# Patient Record
Sex: Female | Born: 1970
Health system: Southern US, Community
[De-identification: ages and names within clinical notes are randomized; demographics above are authoritative.]

## PROBLEM LIST (undated history)

## (undated) ENCOUNTER — Emergency Department (HOSPITAL_BASED_OUTPATIENT_CLINIC_OR_DEPARTMENT_OTHER): Payer: Medicaid Other

## (undated) DIAGNOSIS — Z86718 Personal history of other venous thrombosis and embolism: Secondary | ICD-10-CM

## (undated) DIAGNOSIS — I82409 Acute embolism and thrombosis of unspecified deep veins of unspecified lower extremity: Secondary | ICD-10-CM

## (undated) HISTORY — PX: VASCULAR SURGERY: SHX849

---

## 2009-02-01 ENCOUNTER — Emergency Department (HOSPITAL_BASED_OUTPATIENT_CLINIC_OR_DEPARTMENT_OTHER): Admission: EM | Admit: 2009-02-01 | Discharge: 2009-02-01 | Payer: Self-pay | Admitting: Emergency Medicine

## 2009-02-02 ENCOUNTER — Ambulatory Visit: Payer: Self-pay | Admitting: Interventional Radiology

## 2009-02-02 ENCOUNTER — Inpatient Hospital Stay (HOSPITAL_COMMUNITY): Admission: EM | Admit: 2009-02-02 | Discharge: 2009-02-05 | Payer: Self-pay | Admitting: Internal Medicine

## 2009-02-02 ENCOUNTER — Emergency Department (HOSPITAL_BASED_OUTPATIENT_CLINIC_OR_DEPARTMENT_OTHER): Admission: EM | Admit: 2009-02-02 | Discharge: 2009-02-02 | Payer: Self-pay | Admitting: Emergency Medicine

## 2009-07-13 ENCOUNTER — Inpatient Hospital Stay (HOSPITAL_COMMUNITY): Admission: AD | Admit: 2009-07-13 | Discharge: 2009-07-14 | Payer: Self-pay | Admitting: Obstetrics and Gynecology

## 2009-07-13 ENCOUNTER — Other Ambulatory Visit: Payer: Self-pay | Admitting: Emergency Medicine

## 2009-07-13 ENCOUNTER — Ambulatory Visit: Payer: Self-pay | Admitting: Advanced Practice Midwife

## 2009-08-05 ENCOUNTER — Inpatient Hospital Stay (HOSPITAL_COMMUNITY): Admission: AD | Admit: 2009-08-05 | Discharge: 2009-08-05 | Payer: Self-pay | Admitting: Family Medicine

## 2009-08-05 ENCOUNTER — Ambulatory Visit: Payer: Self-pay | Admitting: Nurse Practitioner

## 2009-08-31 ENCOUNTER — Ambulatory Visit (HOSPITAL_COMMUNITY): Admission: RE | Admit: 2009-08-31 | Discharge: 2009-08-31 | Payer: Self-pay | Admitting: Obstetrics and Gynecology

## 2009-09-21 ENCOUNTER — Ambulatory Visit (HOSPITAL_COMMUNITY): Admission: RE | Admit: 2009-09-21 | Discharge: 2009-09-21 | Payer: Self-pay | Admitting: Obstetrics and Gynecology

## 2009-10-03 ENCOUNTER — Ambulatory Visit: Payer: Self-pay | Admitting: Vascular Surgery

## 2009-10-03 ENCOUNTER — Emergency Department (HOSPITAL_COMMUNITY): Admission: EM | Admit: 2009-10-03 | Discharge: 2009-10-03 | Payer: Self-pay | Admitting: Emergency Medicine

## 2009-10-12 ENCOUNTER — Ambulatory Visit (HOSPITAL_COMMUNITY): Admission: RE | Admit: 2009-10-12 | Discharge: 2009-10-12 | Payer: Self-pay | Admitting: Obstetrics and Gynecology

## 2009-10-18 ENCOUNTER — Ambulatory Visit (HOSPITAL_COMMUNITY): Admission: RE | Admit: 2009-10-18 | Discharge: 2009-10-18 | Payer: Self-pay | Admitting: Obstetrics and Gynecology

## 2009-10-18 ENCOUNTER — Encounter: Admission: RE | Admit: 2009-10-18 | Discharge: 2009-11-02 | Payer: Self-pay | Admitting: Obstetrics and Gynecology

## 2009-11-23 ENCOUNTER — Encounter: Payer: Self-pay | Admitting: Obstetrics and Gynecology

## 2009-11-23 ENCOUNTER — Inpatient Hospital Stay (HOSPITAL_COMMUNITY): Admission: AD | Admit: 2009-11-23 | Discharge: 2009-12-21 | Payer: Self-pay | Admitting: Obstetrics & Gynecology

## 2009-11-27 ENCOUNTER — Encounter: Payer: Self-pay | Admitting: Obstetrics & Gynecology

## 2009-12-03 ENCOUNTER — Encounter: Payer: Self-pay | Admitting: Obstetrics & Gynecology

## 2009-12-21 ENCOUNTER — Ambulatory Visit (HOSPITAL_COMMUNITY)
Admission: RE | Admit: 2009-12-21 | Discharge: 2009-12-21 | Payer: Self-pay | Source: Home / Self Care | Admitting: Obstetrics & Gynecology

## 2010-01-04 ENCOUNTER — Emergency Department (HOSPITAL_BASED_OUTPATIENT_CLINIC_OR_DEPARTMENT_OTHER): Admission: EM | Admit: 2010-01-04 | Discharge: 2010-01-04 | Payer: Self-pay | Admitting: Emergency Medicine

## 2010-01-11 ENCOUNTER — Ambulatory Visit (HOSPITAL_COMMUNITY)
Admission: RE | Admit: 2010-01-11 | Discharge: 2010-01-11 | Payer: Self-pay | Source: Home / Self Care | Attending: Obstetrics & Gynecology | Admitting: Obstetrics & Gynecology

## 2010-02-08 ENCOUNTER — Ambulatory Visit (HOSPITAL_COMMUNITY)
Admission: RE | Admit: 2010-02-08 | Discharge: 2010-02-08 | Payer: Self-pay | Source: Home / Self Care | Attending: Obstetrics & Gynecology | Admitting: Obstetrics & Gynecology

## 2010-03-01 ENCOUNTER — Ambulatory Visit (HOSPITAL_COMMUNITY): Admission: RE | Admit: 2010-03-01 | Payer: Self-pay | Source: Home / Self Care | Admitting: Obstetrics and Gynecology

## 2010-03-05 ENCOUNTER — Ambulatory Visit (HOSPITAL_COMMUNITY)
Admission: RE | Admit: 2010-03-05 | Discharge: 2010-03-05 | Payer: Self-pay | Source: Home / Self Care | Attending: Obstetrics and Gynecology | Admitting: Obstetrics and Gynecology

## 2010-03-08 ENCOUNTER — Inpatient Hospital Stay (HOSPITAL_COMMUNITY)
Admission: RE | Admit: 2010-03-08 | Discharge: 2010-03-11 | DRG: 774 | Disposition: A | Discharge status: Home / Self Care | Payer: Medicaid Other | Source: Ambulatory Visit | Attending: Obstetrics and Gynecology | Admitting: Obstetrics and Gynecology

## 2010-03-08 DIAGNOSIS — Z22330 Carrier of Group B streptococcus: Secondary | ICD-10-CM

## 2010-03-08 DIAGNOSIS — E119 Type 2 diabetes mellitus without complications: Secondary | ICD-10-CM | POA: Diagnosis present

## 2010-03-08 DIAGNOSIS — O99892 Other specified diseases and conditions complicating childbirth: Secondary | ICD-10-CM | POA: Diagnosis present

## 2010-03-08 DIAGNOSIS — O2432 Unspecified pre-existing diabetes mellitus in childbirth: Principal | ICD-10-CM | POA: Diagnosis present

## 2010-03-08 DIAGNOSIS — O09519 Supervision of elderly primigravida, unspecified trimester: Secondary | ICD-10-CM | POA: Diagnosis present

## 2010-03-08 LAB — CBC
Hemoglobin: 12.2 g/dL (ref 12.0–15.0)
MCH: 31.2 pg (ref 26.0–34.0)
MCV: 93.6 fL (ref 78.0–100.0)
Platelets: 277 10*3/uL (ref 150–400)
RBC: 3.91 MIL/uL (ref 3.87–5.11)
WBC: 7.9 10*3/uL (ref 4.0–10.5)

## 2010-03-08 LAB — RPR: RPR Ser Ql: NONREACTIVE

## 2010-03-09 LAB — GLUCOSE, CAPILLARY
Glucose-Capillary: 58 mg/dL — ABNORMAL LOW (ref 70–99)
Glucose-Capillary: 77 mg/dL (ref 70–99)
Glucose-Capillary: 69 mg/dL — ABNORMAL LOW (ref 70–99)
Glucose-Capillary: 172 mg/dL — ABNORMAL HIGH (ref 70–99)
Glucose-Capillary: 100 mg/dL — ABNORMAL HIGH (ref 70–99)

## 2010-03-09 LAB — CBC
HCT: 31.3 % — ABNORMAL LOW (ref 36.0–46.0)
Hemoglobin: 10.7 g/dL — ABNORMAL LOW (ref 12.0–15.0)
RBC: 3.34 MIL/uL — ABNORMAL LOW (ref 3.87–5.11)
WBC: 15.9 10*3/uL — ABNORMAL HIGH (ref 4.0–10.5)

## 2010-03-10 LAB — GLUCOSE, CAPILLARY
Glucose-Capillary: 117 mg/dL — ABNORMAL HIGH (ref 70–99)
Glucose-Capillary: 124 mg/dL — ABNORMAL HIGH (ref 70–99)
Glucose-Capillary: 58 mg/dL — ABNORMAL LOW (ref 70–99)
Glucose-Capillary: 65 mg/dL — ABNORMAL LOW (ref 70–99)
Glucose-Capillary: 90 mg/dL (ref 70–99)

## 2010-03-10 LAB — CBC
Platelets: 244 10*3/uL (ref 150–400)
RBC: 3.33 MIL/uL — ABNORMAL LOW (ref 3.87–5.11)
RDW: 15 % (ref 11.5–15.5)
WBC: 14.5 10*3/uL — ABNORMAL HIGH (ref 4.0–10.5)

## 2010-03-11 LAB — GLUCOSE, CAPILLARY
Glucose-Capillary: 90 mg/dL (ref 70–99)
Glucose-Capillary: 131 mg/dL — ABNORMAL HIGH (ref 70–99)

## 2010-04-16 LAB — GLUCOSE, CAPILLARY
Glucose-Capillary: 102 mg/dL — ABNORMAL HIGH (ref 70–99)
Glucose-Capillary: 104 mg/dL — ABNORMAL HIGH (ref 70–99)
Glucose-Capillary: 106 mg/dL — ABNORMAL HIGH (ref 70–99)
Glucose-Capillary: 106 mg/dL — ABNORMAL HIGH (ref 70–99)
Glucose-Capillary: 113 mg/dL — ABNORMAL HIGH (ref 70–99)
Glucose-Capillary: 117 mg/dL — ABNORMAL HIGH (ref 70–99)
Glucose-Capillary: 118 mg/dL — ABNORMAL HIGH (ref 70–99)
Glucose-Capillary: 118 mg/dL — ABNORMAL HIGH (ref 70–99)
Glucose-Capillary: 120 mg/dL — ABNORMAL HIGH (ref 70–99)
Glucose-Capillary: 122 mg/dL — ABNORMAL HIGH (ref 70–99)
Glucose-Capillary: 122 mg/dL — ABNORMAL HIGH (ref 70–99)
Glucose-Capillary: 123 mg/dL — ABNORMAL HIGH (ref 70–99)
Glucose-Capillary: 126 mg/dL — ABNORMAL HIGH (ref 70–99)
Glucose-Capillary: 126 mg/dL — ABNORMAL HIGH (ref 70–99)
Glucose-Capillary: 127 mg/dL — ABNORMAL HIGH (ref 70–99)
Glucose-Capillary: 129 mg/dL — ABNORMAL HIGH (ref 70–99)
Glucose-Capillary: 130 mg/dL — ABNORMAL HIGH (ref 70–99)
Glucose-Capillary: 132 mg/dL — ABNORMAL HIGH (ref 70–99)
Glucose-Capillary: 138 mg/dL — ABNORMAL HIGH (ref 70–99)
Glucose-Capillary: 138 mg/dL — ABNORMAL HIGH (ref 70–99)
Glucose-Capillary: 140 mg/dL — ABNORMAL HIGH (ref 70–99)
Glucose-Capillary: 141 mg/dL — ABNORMAL HIGH (ref 70–99)
Glucose-Capillary: 142 mg/dL — ABNORMAL HIGH (ref 70–99)
Glucose-Capillary: 143 mg/dL — ABNORMAL HIGH (ref 70–99)
Glucose-Capillary: 145 mg/dL — ABNORMAL HIGH (ref 70–99)
Glucose-Capillary: 152 mg/dL — ABNORMAL HIGH (ref 70–99)
Glucose-Capillary: 154 mg/dL — ABNORMAL HIGH (ref 70–99)
Glucose-Capillary: 158 mg/dL — ABNORMAL HIGH (ref 70–99)
Glucose-Capillary: 158 mg/dL — ABNORMAL HIGH (ref 70–99)
Glucose-Capillary: 159 mg/dL — ABNORMAL HIGH (ref 70–99)
Glucose-Capillary: 164 mg/dL — ABNORMAL HIGH (ref 70–99)
Glucose-Capillary: 171 mg/dL — ABNORMAL HIGH (ref 70–99)
Glucose-Capillary: 58 mg/dL — ABNORMAL LOW (ref 70–99)
Glucose-Capillary: 68 mg/dL — ABNORMAL LOW (ref 70–99)
Glucose-Capillary: 69 mg/dL — ABNORMAL LOW (ref 70–99)
Glucose-Capillary: 70 mg/dL (ref 70–99)
Glucose-Capillary: 74 mg/dL (ref 70–99)
Glucose-Capillary: 77 mg/dL (ref 70–99)
Glucose-Capillary: 80 mg/dL (ref 70–99)
Glucose-Capillary: 85 mg/dL (ref 70–99)
Glucose-Capillary: 88 mg/dL (ref 70–99)
Glucose-Capillary: 94 mg/dL (ref 70–99)
Glucose-Capillary: 94 mg/dL (ref 70–99)
Glucose-Capillary: 97 mg/dL (ref 70–99)
Glucose-Capillary: 99 mg/dL (ref 70–99)

## 2010-04-16 LAB — CBC
HCT: 33.9 % — ABNORMAL LOW (ref 36.0–46.0)
Hemoglobin: 11.6 g/dL — ABNORMAL LOW (ref 12.0–15.0)
MCH: 33.3 pg (ref 26.0–34.0)
MCHC: 34 g/dL (ref 30.0–36.0)
RDW: 13.6 % (ref 11.5–15.5)

## 2010-04-17 LAB — COMPREHENSIVE METABOLIC PANEL
ALT: 15 U/L (ref 0–35)
AST: 25 U/L (ref 0–37)
Albumin: 3.3 g/dL — ABNORMAL LOW (ref 3.5–5.2)
Alkaline Phosphatase: 47 U/L (ref 39–117)
CO2: 22 mEq/L (ref 19–32)
Chloride: 106 mEq/L (ref 96–112)
GFR calc Af Amer: >60 mL/min (ref >60)
Potassium: 3.8 mEq/L (ref 3.5–5.1)
Total Bilirubin: 0.4 mg/dL (ref 0.3–1.2)

## 2010-04-17 LAB — URINALYSIS, ROUTINE W REFLEX MICROSCOPIC
Bilirubin Urine: NEGATIVE
Ketones, ur: NEGATIVE mg/dL
Nitrite: NEGATIVE
Protein, ur: NEGATIVE mg/dL
pH: 6 (ref 5.0–8.0)

## 2010-04-17 LAB — URINE MICROSCOPIC-ADD ON

## 2010-04-17 LAB — CBC
Hemoglobin: 12.4 g/dL (ref 12.0–15.0)
MCV: 96 fL (ref 78.0–100.0)
Platelets: 285 10*3/uL (ref 150–400)
RBC: 3.85 MIL/uL — ABNORMAL LOW (ref 3.87–5.11)
WBC: 11.1 10*3/uL — ABNORMAL HIGH (ref 4.0–10.5)

## 2010-04-17 LAB — GLUCOSE, CAPILLARY
Glucose-Capillary: 106 mg/dL — ABNORMAL HIGH (ref 70–99)
Glucose-Capillary: 109 mg/dL — ABNORMAL HIGH (ref 70–99)
Glucose-Capillary: 112 mg/dL — ABNORMAL HIGH (ref 70–99)
Glucose-Capillary: 114 mg/dL — ABNORMAL HIGH (ref 70–99)
Glucose-Capillary: 121 mg/dL — ABNORMAL HIGH (ref 70–99)
Glucose-Capillary: 123 mg/dL — ABNORMAL HIGH (ref 70–99)
Glucose-Capillary: 133 mg/dL — ABNORMAL HIGH (ref 70–99)
Glucose-Capillary: 138 mg/dL — ABNORMAL HIGH (ref 70–99)
Glucose-Capillary: 163 mg/dL — ABNORMAL HIGH (ref 70–99)
Glucose-Capillary: 172 mg/dL — ABNORMAL HIGH (ref 70–99)
Glucose-Capillary: 177 mg/dL — ABNORMAL HIGH (ref 70–99)
Glucose-Capillary: 184 mg/dL — ABNORMAL HIGH (ref 70–99)
Glucose-Capillary: 202 mg/dL — ABNORMAL HIGH (ref 70–99)
Glucose-Capillary: 43 mg/dL — CL (ref 70–99)
Glucose-Capillary: 73 mg/dL (ref 70–99)
Glucose-Capillary: 80 mg/dL (ref 70–99)

## 2010-04-17 LAB — FETAL FIBRONECTIN: Fetal Fibronectin: POSITIVE — AB

## 2010-04-17 LAB — STREP B DNA PROBE: Strep Group B Ag: POSITIVE

## 2010-04-19 LAB — COMPREHENSIVE METABOLIC PANEL
ALT: 28 U/L (ref 0–35)
Alkaline Phosphatase: 41 U/L (ref 39–117)
CO2: 25 mEq/L (ref 19–32)
Calcium: 9.2 mg/dL (ref 8.4–10.5)
Chloride: 105 mEq/L (ref 96–112)
GFR calc non Af Amer: >60 mL/min (ref >60)
Glucose, Bld: 161 mg/dL — ABNORMAL HIGH (ref 70–99)
Sodium: 136 mEq/L (ref 135–145)
Total Bilirubin: 0.5 mg/dL (ref 0.3–1.2)

## 2010-04-19 LAB — DIFFERENTIAL
Basophils Absolute: 0 10*3/uL (ref 0.0–0.1)
Basophils Relative: 0 % (ref 0–1)
Eosinophils Absolute: 0.1 10*3/uL (ref 0.0–0.7)
Eosinophils Relative: 1 % (ref 0–5)
Neutrophils Relative %: 68 % (ref 43–77)

## 2010-04-19 LAB — CBC
HCT: 35.9 % — ABNORMAL LOW (ref 36.0–46.0)
Hemoglobin: 12.4 g/dL (ref 12.0–15.0)
MCHC: 34.5 g/dL (ref 30.0–36.0)
RBC: 3.9 MIL/uL (ref 3.87–5.11)

## 2010-04-19 LAB — POCT CARDIAC MARKERS

## 2010-04-19 LAB — D-DIMER, QUANTITATIVE: D-Dimer, Quant: 0.44 ug/mL-FEU (ref 0.00–0.48)

## 2010-04-21 LAB — BASIC METABOLIC PANEL
CO2: 22 mEq/L (ref 19–32)
Calcium: 8.5 mg/dL (ref 8.4–10.5)
Calcium: 8.7 mg/dL (ref 8.4–10.5)
Chloride: 105 mEq/L (ref 96–112)
Creatinine, Ser: 0.82 mg/dL (ref 0.4–1.2)
GFR calc Af Amer: >60 mL/min (ref >60)
GFR calc Af Amer: >60 mL/min (ref >60)
GFR calc non Af Amer: >60 mL/min (ref >60)
GFR calc non Af Amer: >60 mL/min (ref >60)
Glucose, Bld: 88 mg/dL (ref 70–99)
Glucose, Bld: 92 mg/dL (ref 70–99)
Potassium: 3.6 mEq/L (ref 3.5–5.1)
Potassium: 3.7 mEq/L (ref 3.5–5.1)
Sodium: 135 mEq/L (ref 135–145)
Sodium: 137 mEq/L (ref 135–145)
Sodium: 138 mEq/L (ref 135–145)

## 2010-04-21 LAB — CBC
HCT: 40.1 % (ref 36.0–46.0)
HCT: 43.7 % (ref 36.0–46.0)
Hemoglobin: 12.8 g/dL (ref 12.0–15.0)
Hemoglobin: 13.7 g/dL (ref 12.0–15.0)
Hemoglobin: 15.2 g/dL — ABNORMAL HIGH (ref 12.0–15.0)
MCHC: 34.5 g/dL (ref 30.0–36.0)
MCHC: 34.7 g/dL (ref 30.0–36.0)
MCV: 95.9 fL (ref 78.0–100.0)
Platelets: 235 10*3/uL (ref 150–400)
RBC: 3.86 MIL/uL — ABNORMAL LOW (ref 3.87–5.11)
RBC: 4.55 MIL/uL (ref 3.87–5.11)
RDW: 13.3 % (ref 11.5–15.5)
RDW: 13.3 % (ref 11.5–15.5)
RDW: 13.5 % (ref 11.5–15.5)
WBC: 7.5 10*3/uL (ref 4.0–10.5)

## 2010-04-21 LAB — HEMOGLOBIN A1C
Hgb A1c MFr Bld: 6.1 % (ref 4.6–6.1)
Mean Plasma Glucose: 128 mg/dL

## 2010-04-21 LAB — DIFFERENTIAL
Basophils Absolute: 0 10*3/uL (ref 0.0–0.1)
Eosinophils Relative: 3 % (ref 0–5)
Lymphocytes Relative: 33 % (ref 12–46)
Lymphs Abs: 2.4 10*3/uL (ref 0.7–4.0)
Neutro Abs: 4.2 10*3/uL (ref 1.7–7.7)

## 2010-04-21 LAB — LIPID PANEL
HDL: 32 mg/dL — ABNORMAL LOW (ref >39)
Total CHOL/HDL Ratio: 5.7 RATIO
Triglycerides: 142 mg/dL (ref <150)

## 2010-04-21 LAB — GLUCOSE, CAPILLARY
Glucose-Capillary: 103 mg/dL — ABNORMAL HIGH (ref 70–99)
Glucose-Capillary: 77 mg/dL (ref 70–99)
Glucose-Capillary: 87 mg/dL (ref 70–99)

## 2010-04-21 LAB — PROTIME-INR
INR: 1.23 (ref 0.00–1.49)
INR: 1.34 (ref 0.00–1.49)
Prothrombin Time: 14.5 seconds (ref 11.6–15.2)
Prothrombin Time: 15.4 seconds — ABNORMAL HIGH (ref 11.6–15.2)

## 2010-04-21 LAB — TSH: TSH: 0.636 u[IU]/mL (ref 0.350–4.500)

## 2010-04-21 LAB — MAGNESIUM: Magnesium: 2.1 mg/dL (ref 1.5–2.5)

## 2010-04-22 LAB — CBC
HCT: 39.7 % (ref 36.0–46.0)
Hemoglobin: 13.6 g/dL (ref 12.0–15.0)
MCHC: 34.1 g/dL (ref 30.0–36.0)
MCV: 95.6 fL (ref 78.0–100.0)
RDW: 12.8 % (ref 11.5–15.5)

## 2010-04-22 LAB — GC/CHLAMYDIA PROBE AMP, GENITAL: Chlamydia, DNA Probe: NEGATIVE

## 2010-04-22 LAB — DIFFERENTIAL
Basophils Absolute: 0.4 10*3/uL — ABNORMAL HIGH (ref 0.0–0.1)
Eosinophils Absolute: 0.1 10*3/uL (ref 0.0–0.7)
Eosinophils Relative: 1 % (ref 0–5)
Lymphocytes Relative: 37 % (ref 12–46)
Monocytes Absolute: 0.6 10*3/uL (ref 0.1–1.0)

## 2010-04-22 LAB — WET PREP, GENITAL

## 2010-05-06 LAB — URINALYSIS, ROUTINE W REFLEX MICROSCOPIC
Glucose, UA: NEGATIVE mg/dL
Specific Gravity, Urine: 1.025 (ref 1.005–1.030)
Urobilinogen, UA: 1 mg/dL (ref 0.0–1.0)

## 2010-05-06 LAB — BASIC METABOLIC PANEL
BUN: 16 mg/dL (ref 6–23)
Creatinine, Ser: 0.8 mg/dL (ref 0.4–1.2)
GFR calc Af Amer: >60 mL/min (ref >60)
GFR calc non Af Amer: >60 mL/min (ref >60)
Potassium: 4.9 mEq/L (ref 3.5–5.1)

## 2010-05-06 LAB — URINE MICROSCOPIC-ADD ON

## 2010-05-06 LAB — PROTIME-INR
INR: 1.05 (ref 0.00–1.49)
Prothrombin Time: 13.6 seconds (ref 11.6–15.2)

## 2010-05-06 LAB — DIFFERENTIAL
Lymphocytes Relative: 29 % (ref 12–46)
Lymphs Abs: 3 10*3/uL (ref 0.7–4.0)
Neutrophils Relative %: 61 % (ref 43–77)

## 2010-05-06 LAB — PREGNANCY, URINE: Preg Test, Ur: NEGATIVE

## 2010-05-06 LAB — APTT: aPTT: 26 seconds (ref 24–37)

## 2010-05-06 LAB — CBC
HCT: 40.8 % (ref 36.0–46.0)
Platelets: 284 10*3/uL (ref 150–400)
WBC: 10.2 10*3/uL (ref 4.0–10.5)

## 2011-03-09 ENCOUNTER — Encounter (HOSPITAL_BASED_OUTPATIENT_CLINIC_OR_DEPARTMENT_OTHER): Payer: Self-pay | Admitting: *Deleted

## 2011-03-09 ENCOUNTER — Emergency Department (HOSPITAL_BASED_OUTPATIENT_CLINIC_OR_DEPARTMENT_OTHER)
Admission: EM | Admit: 2011-03-09 | Discharge: 2011-03-09 | Disposition: A | Discharge status: Home / Self Care | Payer: Medicaid Other | Attending: Emergency Medicine | Admitting: Emergency Medicine

## 2011-03-09 DIAGNOSIS — R1110 Vomiting, unspecified: Secondary | ICD-10-CM | POA: Insufficient documentation

## 2011-03-09 DIAGNOSIS — F17200 Nicotine dependence, unspecified, uncomplicated: Secondary | ICD-10-CM | POA: Insufficient documentation

## 2011-03-09 DIAGNOSIS — E119 Type 2 diabetes mellitus without complications: Secondary | ICD-10-CM | POA: Insufficient documentation

## 2011-03-09 DIAGNOSIS — J029 Acute pharyngitis, unspecified: Secondary | ICD-10-CM | POA: Insufficient documentation

## 2011-03-09 HISTORY — DX: Personal history of other venous thrombosis and embolism: Z86.718

## 2011-03-09 LAB — RAPID STREP SCREEN (MED CTR MEBANE ONLY): Streptococcus, Group A Screen (Direct): NEGATIVE

## 2011-03-09 NOTE — ED Notes (Signed)
Pt states she has had sore throat, left ear pain, vomited yesterday, diarrhea, chills and cough for 3 days.

## 2011-03-09 NOTE — ED Notes (Signed)
D/c home- no Rx given 

## 2011-03-09 NOTE — ED Provider Notes (Signed)
History     CSN: [id number]  Arrival date & time 03/09/11  1559   First MD Initiated Contact with Patient 03/09/11 1639      Chief Complaint  Patient presents with  . Sore Throat    (Consider location/radiation/quality/duration/timing/severity/associated sxs/prior treatment) HPI Comments: Pt states that she vomited a couple of times yesterday,but has not had any vomiting today  Patient is a 41 y.o. female presenting with pharyngitis. The history is provided by the patient. No language interpreter was used.  Sore Throat This is a new problem. The current episode started in the past 7 days. The problem occurs constantly. The problem has been unchanged. Associated symptoms include coughing, a sore throat and vomiting. Pertinent negatives include no fever or neck pain. The symptoms are aggravated by swallowing. She has tried nothing for the symptoms.    Past Medical History  Diagnosis Date  . Diabetes mellitus   . History of blood clots     History reviewed. No pertinent past surgical history.  History reviewed. No pertinent family history.  History  Substance Use Topics  . Smoking status: Current Everyday Smoker  . Smokeless tobacco: Not on file  . Alcohol Use: No    OB History    Grav Para Term Preterm Abortions TAB SAB Ect Mult Living                  Review of Systems  Constitutional: Negative for fever.  HENT: Positive for sore throat. Negative for neck pain.   Respiratory: Positive for cough.   Gastrointestinal: Positive for vomiting.  All other systems reviewed and are negative.    Allergies  Review of patient's allergies indicates no known allergies.  Home Medications   Current Outpatient Rx  Name Route Sig Dispense Refill  . ASPIRIN EC 81 MG PO TBEC Oral Take 81 mg by mouth daily.    Marland Kitchen METFORMIN HCL 500 MG PO TABS Oral Take 500 mg by mouth 2 (two) times daily with a meal.      BP 121/68  Pulse 92  Temp(Src) 98.4 F (36.9 C) (Oral)  Resp 20  Ht  5\' 6"  (1.676 m)  Wt 255 lb (115.667 kg)  BMI 41.16 kg/m2  SpO2 98%  LMP 02/18/2011  Physical Exam  Nursing note and vitals reviewed. Constitutional: She is oriented to person, place, and time. She appears well-developed and well-nourished.  HENT:  Head: Normocephalic and atraumatic.  Right Ear: External ear normal.  Left Ear: External ear normal.  Mouth/Throat: Posterior oropharyngeal edema present. No oropharyngeal exudate or tonsillar abscesses.  Eyes: Conjunctivae and EOM are normal.  Cardiovascular: Normal rate and regular rhythm.   Pulmonary/Chest: Effort normal and breath sounds normal.  Musculoskeletal: Normal range of motion.  Neurological: She is alert and oriented to person, place, and time.  Skin: Skin is warm and dry.    ED Course  Procedures (including critical care time)   Labs Reviewed  RAPID STREP SCREEN   No results found.   1. Pharyngitis       MDM  Negative strep:symptoms likely viral:don't think any further testing is needed at this time        Teressa Lower, NP 03/09/11 1734

## 2011-03-09 NOTE — ED Provider Notes (Signed)
Medical screening examination/treatment/procedure(s) were performed by non-physician practitioner and as supervising physician I was immediately available for consultation/collaboration.  Rosea Dory, MD 03/09/11 2201 

## 2011-04-30 ENCOUNTER — Other Ambulatory Visit (HOSPITAL_COMMUNITY): Payer: Self-pay | Admitting: Urology

## 2011-04-30 DIAGNOSIS — N361 Urethral diverticulum: Secondary | ICD-10-CM

## 2011-04-30 DIAGNOSIS — N281 Cyst of kidney, acquired: Secondary | ICD-10-CM

## 2011-05-02 ENCOUNTER — Ambulatory Visit (HOSPITAL_COMMUNITY)
Admission: RE | Admit: 2011-05-02 | Discharge: 2011-05-02 | Payer: Medicaid Other | Source: Ambulatory Visit | Attending: Urology | Admitting: Urology

## 2011-05-02 ENCOUNTER — Ambulatory Visit (HOSPITAL_COMMUNITY)
Admission: RE | Admit: 2011-05-02 | Discharge: 2011-05-02 | Disposition: A | Discharge status: Home / Self Care | Payer: Medicaid Other | Source: Ambulatory Visit | Attending: Urology | Admitting: Urology

## 2011-05-02 DIAGNOSIS — N361 Urethral diverticulum: Secondary | ICD-10-CM | POA: Insufficient documentation

## 2011-05-02 DIAGNOSIS — K7689 Other specified diseases of liver: Secondary | ICD-10-CM | POA: Insufficient documentation

## 2011-05-02 DIAGNOSIS — K4090 Unilateral inguinal hernia, without obstruction or gangrene, not specified as recurrent: Secondary | ICD-10-CM | POA: Insufficient documentation

## 2011-05-02 DIAGNOSIS — R188 Other ascites: Secondary | ICD-10-CM | POA: Insufficient documentation

## 2011-05-02 DIAGNOSIS — N281 Cyst of kidney, acquired: Secondary | ICD-10-CM | POA: Insufficient documentation

## 2011-05-02 MED ORDER — GADOBENATE DIMEGLUMINE 529 MG/ML IV SOLN
20.0000 mL | Freq: Once | INTRAVENOUS | Status: AC | PRN
  Administered 2011-05-02: 20 mL via INTRAVENOUS

## 2011-05-05 LAB — POCT I-STAT, CHEM 8
BUN: 15 mg/dL (ref 6–23)
Calcium, Ion: 1.16 mmol/L (ref 1.12–1.32)
TCO2: 23 mmol/L (ref 0–100)

## 2011-05-16 IMAGING — CT CT ANGIO CHEST
2 of 6 series · 18 of 36 positions shown · IV contrast (agent unspecified)
Comparison: Chest radiographs done today.

CLINICAL DATA: Chest pain, tachycardia and shortness of breath.
17 weeks.  No.

CT ANGIOGRAPHY CHEST WITH CONTRAST
TECHNIQUE: Multidetector CT imaging of the chest was performed
using the standard protocol during bolus administration of
intravenous contrast.  Multiplanar CT image reconstructions
including MIPs were obtained to evaluate the vascular anatomy. The
patient was shielded for the examination.
Contrast:  90 ml [id number]-8CC intravenously.

[Series 2: pe · axial · 0.66mm/px · z∈[-206,-31]mm · 17 of 158 slices shown]
[im 9/158  lung]
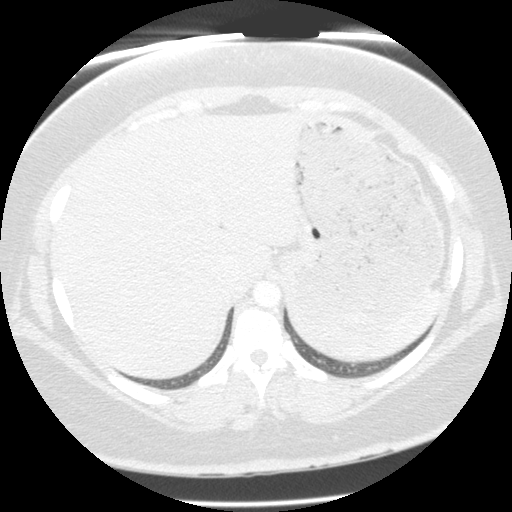
[im 18/158  mediastinal]
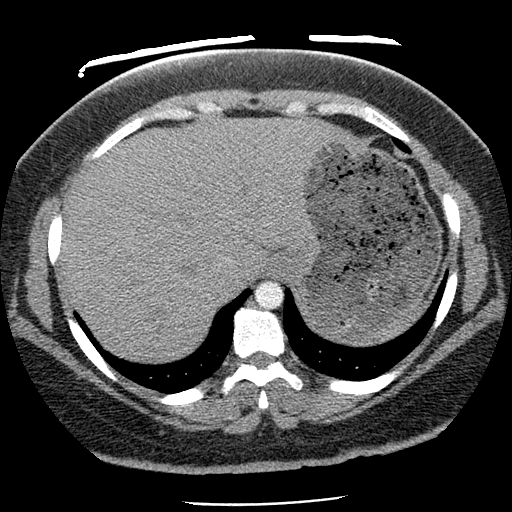
[im 27/158  lung]
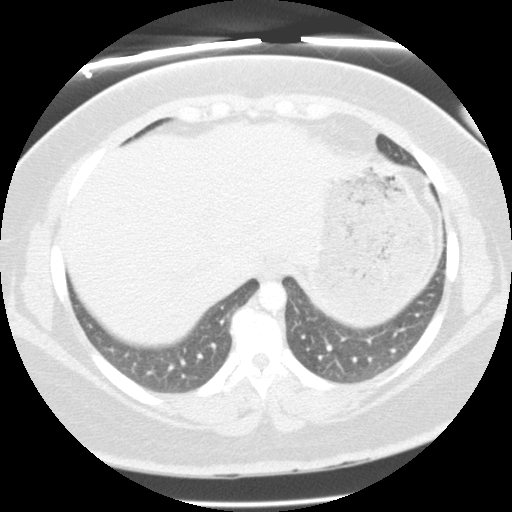
[im 35/158  mediastinal]
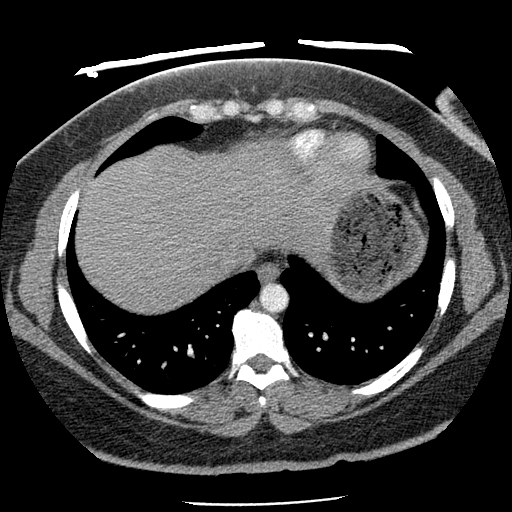
[im 44/158  lung]
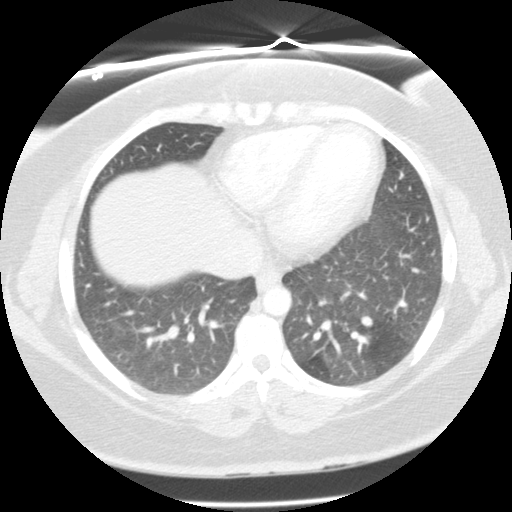
[im 53/158  mediastinal]
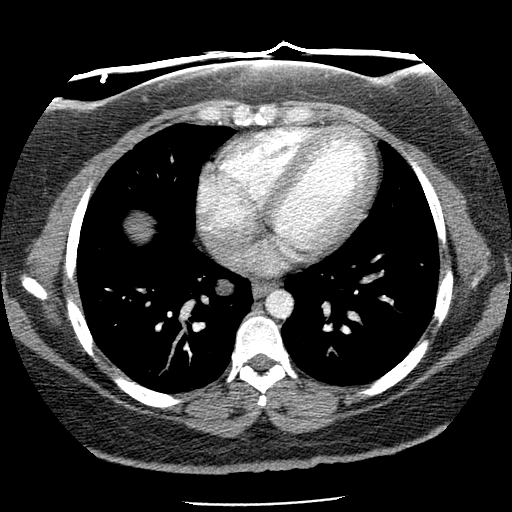
[im 62/158  lung]
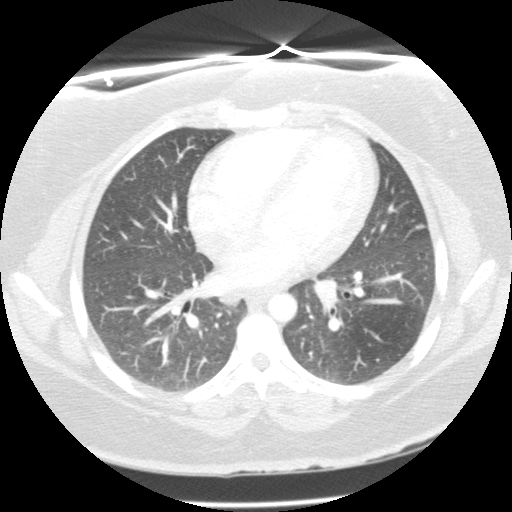
[im 70/158  mediastinal]
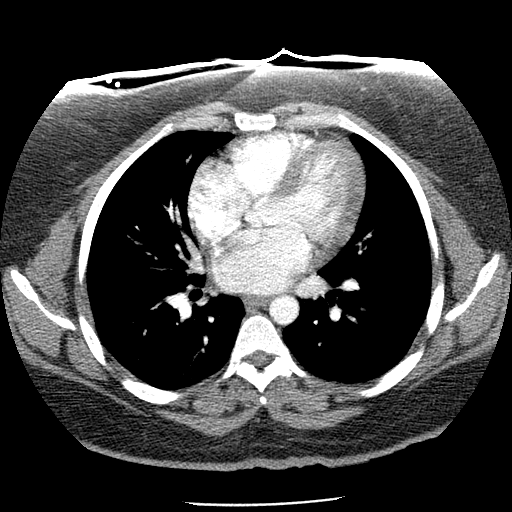
[im 79/158  lung]
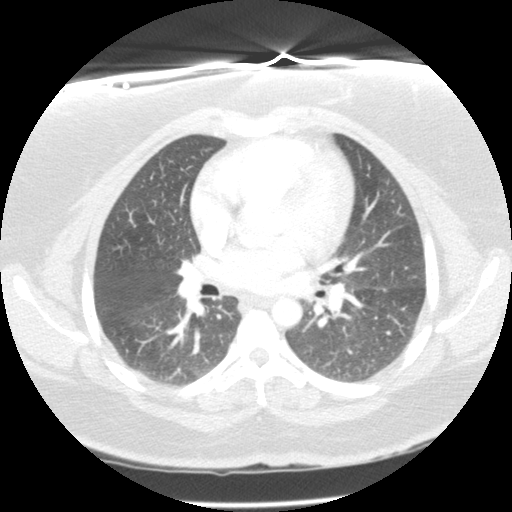
[im 88/158  mediastinal]
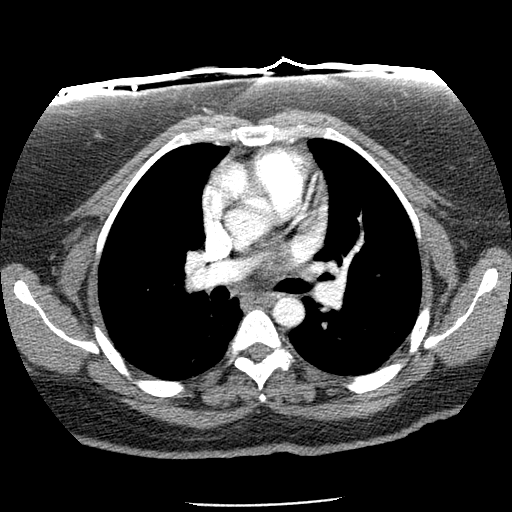
[im 96/158  lung]
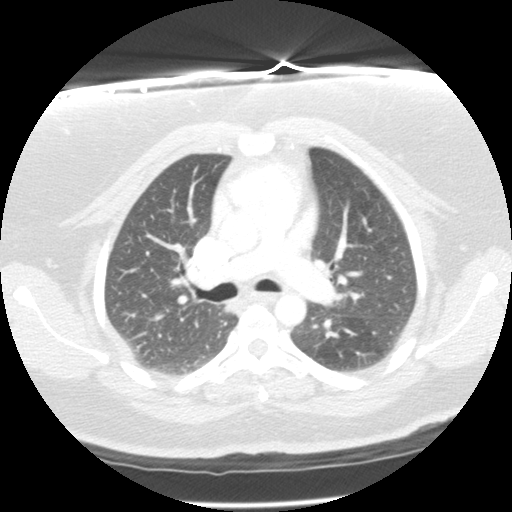
[im 105/158  mediastinal]
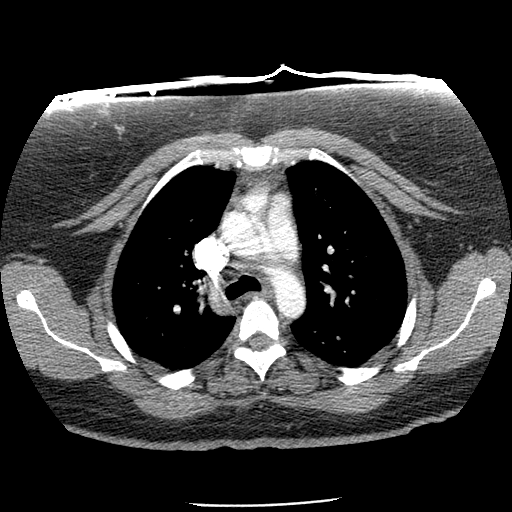
[im 114/158  lung]
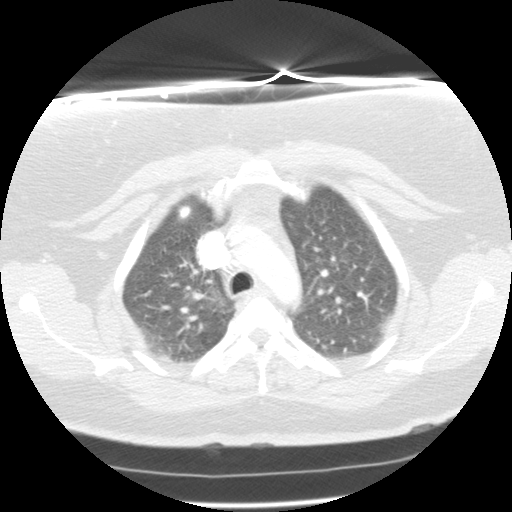
[im 123/158  mediastinal]
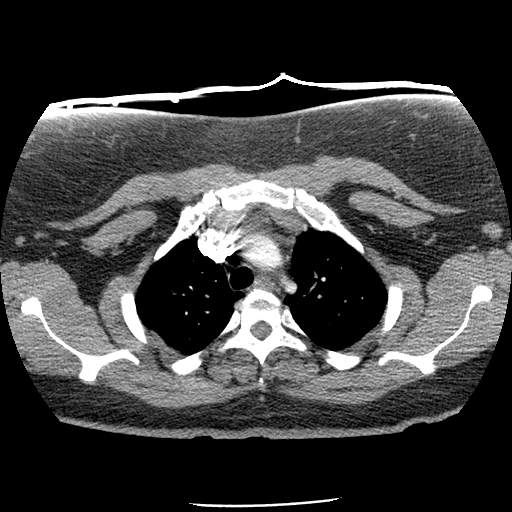
[im 131/158  lung]
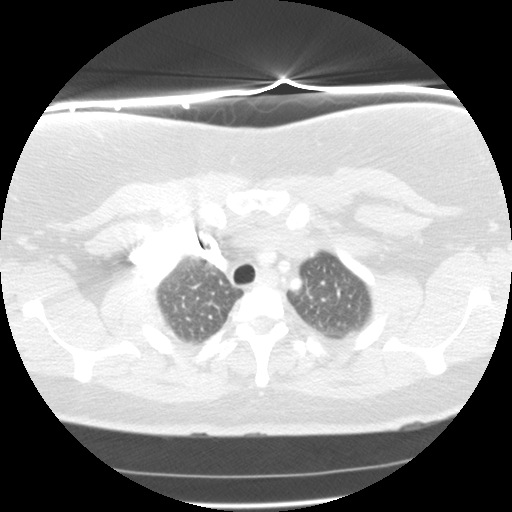
[im 140/158  mediastinal]
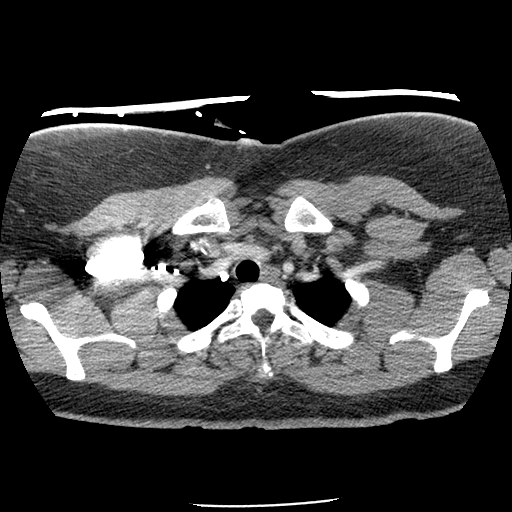
[im 149/158  lung]
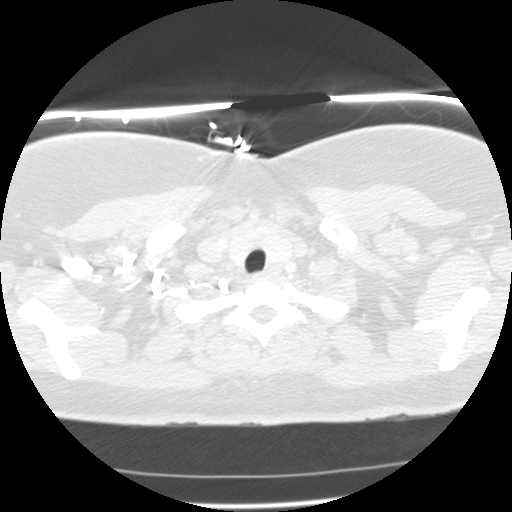

[Series 204: cor mpr · coronal · 0.66mm/px · 1 of 130 slices shown]
[im 65/130  mediastinal]
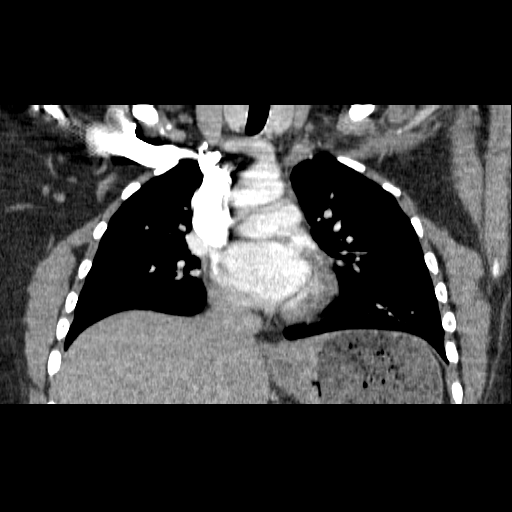

[18 of 36 positions shown; findings below may reference images not displayed]

FINDINGS: The pulmonary arteries are well opacified with contrast.
There is no evidence of acute pulmonary embolism.  The thoracic
aorta appears normal.  There is some pulsation artifact.  A small
amount residual thymic tissue is noted.

There are no enlarged mediastinal or hilar lymph nodes.  There is
no pleural or pericardial effusion.  The lungs are clear.

The visualized upper abdomen appears normal.  No osseous
abnormalities are identified.

Review of the MIP images confirms the above findings.
IMPRESSION: Negative for acute pulmonary embolism or other acute chest process.

## 2011-07-08 ENCOUNTER — Emergency Department (HOSPITAL_BASED_OUTPATIENT_CLINIC_OR_DEPARTMENT_OTHER)
Admission: EM | Admit: 2011-07-08 | Discharge: 2011-07-08 | Disposition: A | Discharge status: Home / Self Care | Payer: Medicaid Other | Attending: Emergency Medicine | Admitting: Emergency Medicine

## 2011-07-08 ENCOUNTER — Encounter (HOSPITAL_BASED_OUTPATIENT_CLINIC_OR_DEPARTMENT_OTHER): Payer: Self-pay | Admitting: *Deleted

## 2011-07-08 DIAGNOSIS — L0291 Cutaneous abscess, unspecified: Secondary | ICD-10-CM

## 2011-07-08 DIAGNOSIS — F17200 Nicotine dependence, unspecified, uncomplicated: Secondary | ICD-10-CM | POA: Insufficient documentation

## 2011-07-08 DIAGNOSIS — L02219 Cutaneous abscess of trunk, unspecified: Secondary | ICD-10-CM | POA: Insufficient documentation

## 2011-07-08 DIAGNOSIS — E119 Type 2 diabetes mellitus without complications: Secondary | ICD-10-CM | POA: Insufficient documentation

## 2011-07-08 MED ORDER — SULFAMETHOXAZOLE-TRIMETHOPRIM 800-160 MG PO TABS: 1.0000 | ORAL_TABLET | Freq: Two times a day (BID) | ORAL | Status: AC

## 2011-07-08 MED ORDER — CEPHALEXIN 500 MG PO CAPS: 500.0000 mg | ORAL_CAPSULE | Freq: Four times a day (QID) | ORAL | Status: AC

## 2011-07-08 MED ORDER — TETANUS-DIPHTH-ACELL PERTUSSIS 5-2.5-18.5 LF-MCG/0.5 IM SUSP
0.5000 mL | Freq: Once | INTRAMUSCULAR | Status: AC
  Administered 2011-07-08: 0.5 mL via INTRAMUSCULAR
  Filled 2011-07-08: qty 0.5

## 2011-07-08 NOTE — ED Provider Notes (Signed)
History     CSN: [id number]  Arrival date & time 07/08/11  1836   First MD Initiated Contact with Patient 07/08/11 1920      Chief Complaint  Patient presents with  . Abscess    (Consider location/radiation/quality/duration/timing/severity/associated sxs/prior treatment) HPI Comments: Pt states that she has an open wound to the left lower abdomen and now she is getting redness to the the area  Patient is a 41 y.o. female presenting with abscess. The history is provided by the patient. No language interpreter was used.  Abscess  This is a new problem. The current episode started more than one week ago. The problem occurs continuously. The problem has been unchanged. The abscess is present on the abdomen. The problem is mild. It is unknown what she was exposed to.    Past Medical History  Diagnosis Date  . Diabetes mellitus   . History of blood clots     History reviewed. No pertinent past surgical history.  History reviewed. No pertinent family history.  History  Substance Use Topics  . Smoking status: Current Everyday Smoker  . Smokeless tobacco: Not on file  . Alcohol Use: No    OB History    Grav Para Term Preterm Abortions TAB SAB Ect Mult Living                  Review of Systems  Constitutional: Negative.   Respiratory: Negative.   Cardiovascular: Negative.     Allergies  Review of patient's allergies indicates no known allergies.  Home Medications   Current Outpatient Rx  Name Route Sig Dispense Refill  . METFORMIN HCL 500 MG PO TABS Oral Take 500 mg by mouth 2 (two) times daily with a meal.    . ASPIRIN EC 81 MG PO TBEC Oral Take 81 mg by mouth daily.      BP 122/67  Pulse 75  Temp(Src) 98.7 F (37.1 C) (Oral)  Resp 16  Ht 5\' 6"  (1.676 m)  Wt 250 lb (113.399 kg)  BMI 40.35 kg/m2  SpO2 100%  LMP 07/04/2011  Physical Exam  Nursing note and vitals reviewed. Constitutional: She is oriented to person, place, and time. She appears  well-developed and well-nourished.  Cardiovascular: Normal rate and regular rhythm.   Pulmonary/Chest: Effort normal and breath sounds normal.  Musculoskeletal: Normal range of motion.  Neurological: She is alert and oriented to person, place, and time.  Skin:       Pt has an ulcer to the left lower abdomen which is 1cm in diameter:pt has area of redness and warmth noted to the area     ED Course  Procedures (including critical care time)  Labs Reviewed - No data to display No results found.   1. Cellulitis and abscess       MDM  Gave pt instructions on when to return:pt placed on antibiotics        Teressa Lower, NP 07/08/11 1937

## 2011-07-08 NOTE — ED Notes (Signed)
Pt c/o abscess to left lower abd  X 1 week

## 2011-07-08 NOTE — Discharge Instructions (Signed)
Abscess An abscess (boil or furuncle) is an infected area under your skin. This area is filled with yellowish white fluid (pus). HOME CARE   Only take medicine as told by your doctor.   Keep the skin clean around your abscess. Keep clothes that may touch the abscess clean.   Change any bandages (dressings) as told by your doctor.   Avoid direct skin contact with other people. The infection can spread by skin contact with others.   Practice good hygiene and do not share personal care items.   Do not share athletic equipment, towels, or whirlpools. Shower after every practice or work out session.   If a draining area cannot be covered:   Do not play sports.   Children should not go to daycare until the wound has healed or until fluid (drainage) stops coming out of the wound.   See your doctor for a follow-up visit as told.  GET HELP RIGHT AWAY IF:   There is more pain, puffiness (swelling), and redness in the wound site.   There is fluid or bleeding from the wound site.   You have muscle aches, chills, fever, or feel sick.   You or your child has a temperature by mouth above 102 F (38.9 C), not controlled by medicine.   Your baby is older than 3 months with a rectal temperature of 102 F (38.9 C) or higher.  MAKE SURE YOU:   Understand these instructions.   Will watch your condition.   Will get help right away if you are not doing well or get worse.  Document Released: 07/09/2007 Document Revised: 01/09/2011 Document Reviewed: 07/09/2007 [employer] Patient Information 2012 [employer].Cellulitis Cellulitis is an infection of the tissue under the skin. The infected area is usually red and tender. This is caused by germs. These germs enter the body through cuts or sores. This usually happens in the arms or lower legs. HOME CARE   Take your medicine as told. Finish it even if you start to feel better.   If the infection is on the arm or leg, keep it raised (elevated).     Use a warm cloth on the infected area several times a day.   See your doctor for a follow-up visit as told.  GET HELP RIGHT AWAY IF:   You are tired or confused.   You throw up (vomit).   You have watery poop (diarrhea).   You feel ill and have muscle aches.   You have a fever.  MAKE SURE YOU:   Understand these instructions.   Will watch your condition.   Will get help right away if you are not doing well or get worse.  Document Released: 07/09/2007 Document Revised: 01/09/2011 Document Reviewed: 12/22/2008 [employer] Patient Information 2012 [employer]. 

## 2011-07-09 NOTE — ED Provider Notes (Signed)
Medical screening examination/treatment/procedure(s) were performed by non-physician practitioner and as supervising physician I was immediately available for consultation/collaboration.   Lashina Milles L Bennie Chirico, MD 07/09/11 0218 

## 2011-07-16 IMAGING — US US OB TRANSVAGINAL
1 series · 14 of 14 positions shown · non-contrast
Comparison: none

OBSTETRICAL ULTRASOUND:
 This ultrasound was performed in The [HOSPITAL], and the [facility] report will be stored to [REDACTED] PACS.  This report is also available in [HOSPITAL]?s accessANYware.

[Series 1: us ob transvaginal · 14 of 14 slices shown]
[im 1/14]
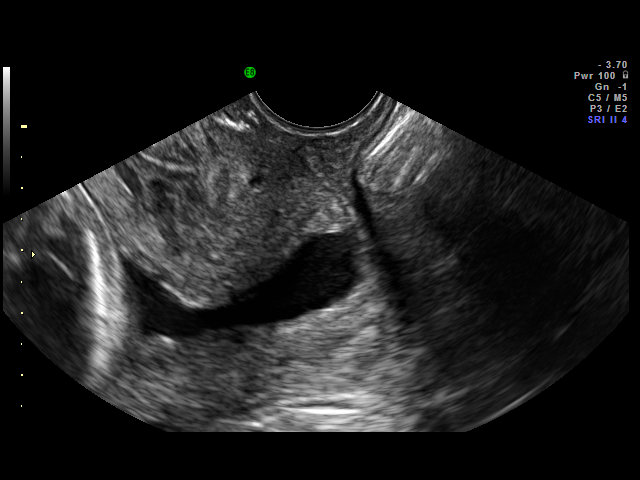
[im 2/14]
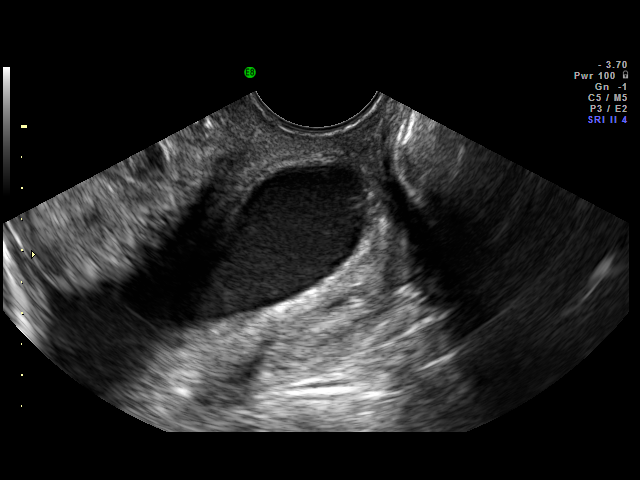
[im 3/14]
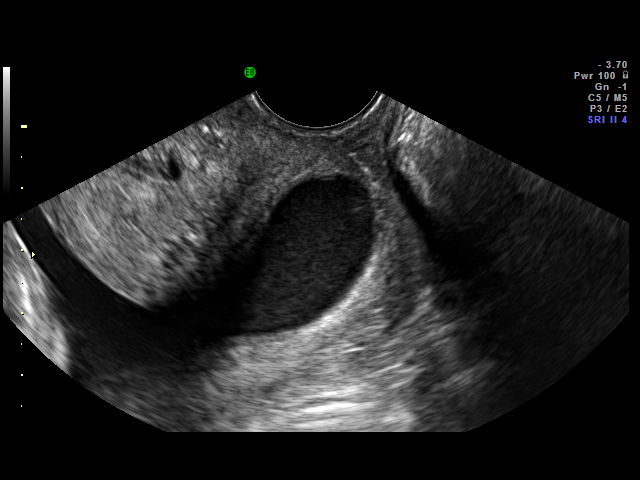
[im 4/14]
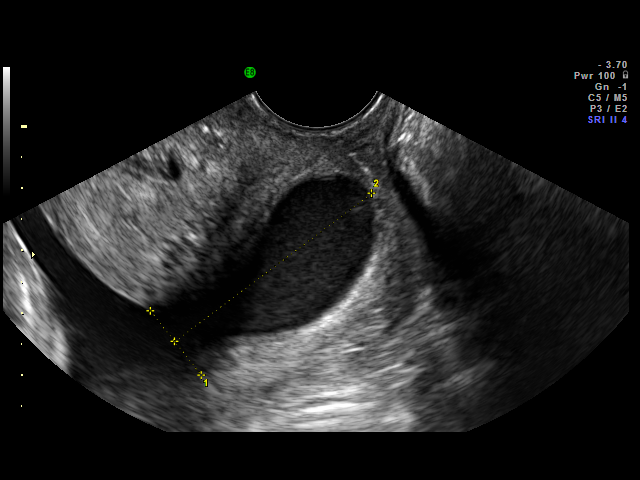
[im 5/14]
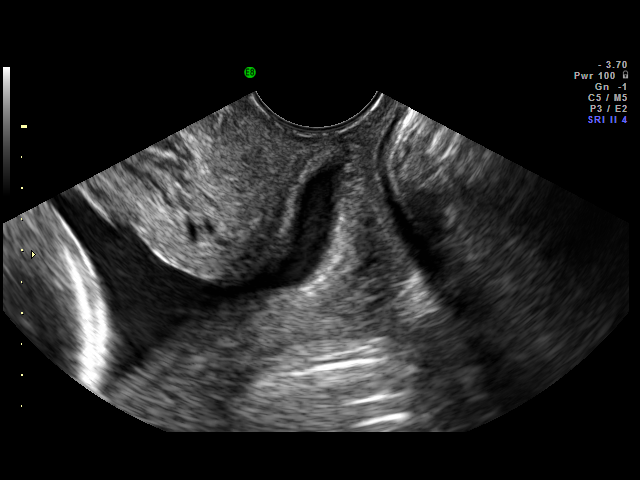
[im 6/14]
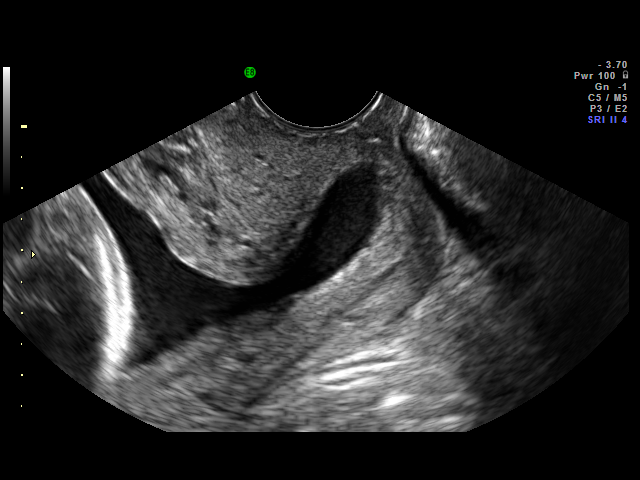
[im 7/14]
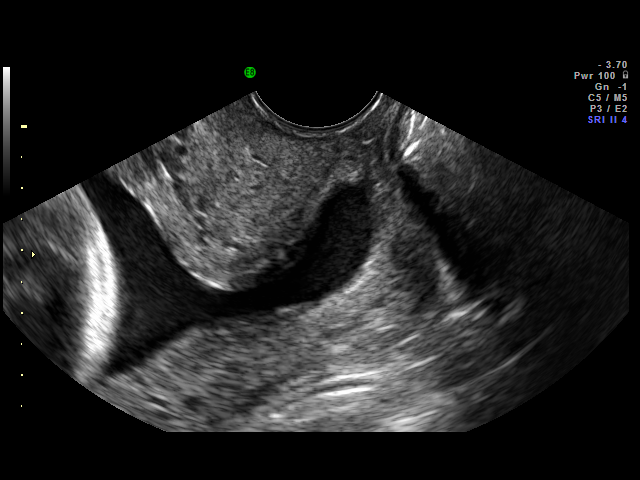
[im 8/14]
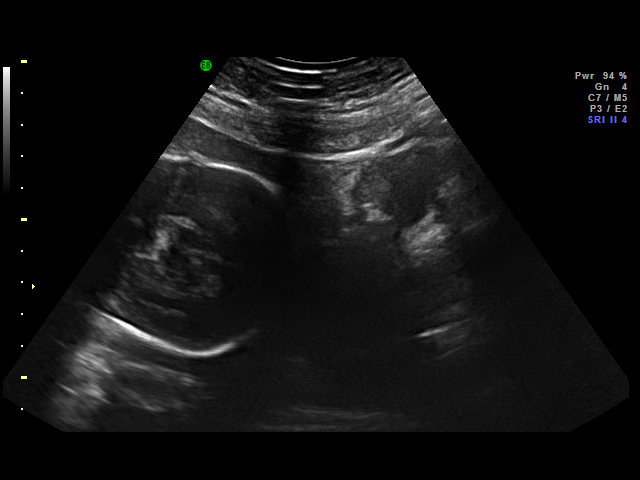
[im 9/14]
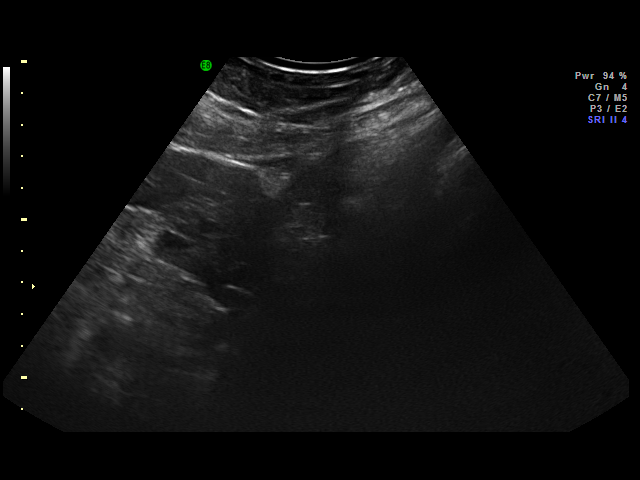
[im 10/14]
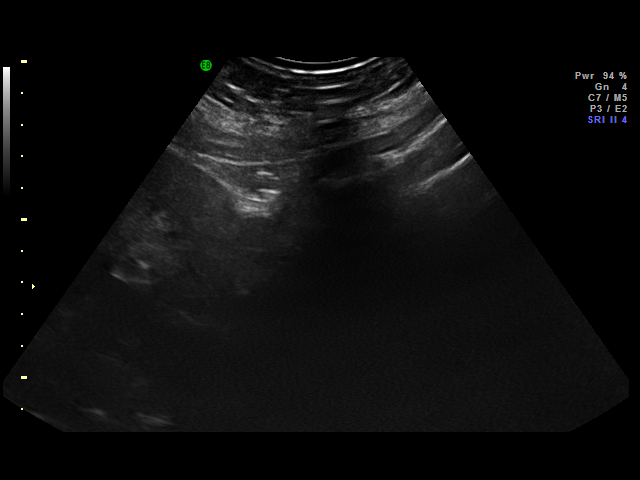
[im 11/14]
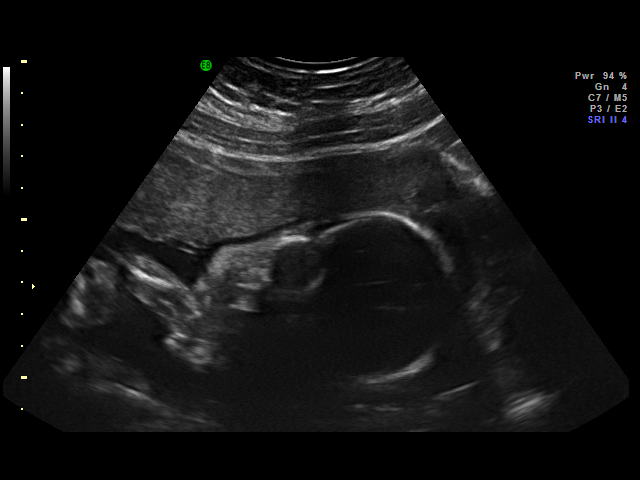
[im 12/14]
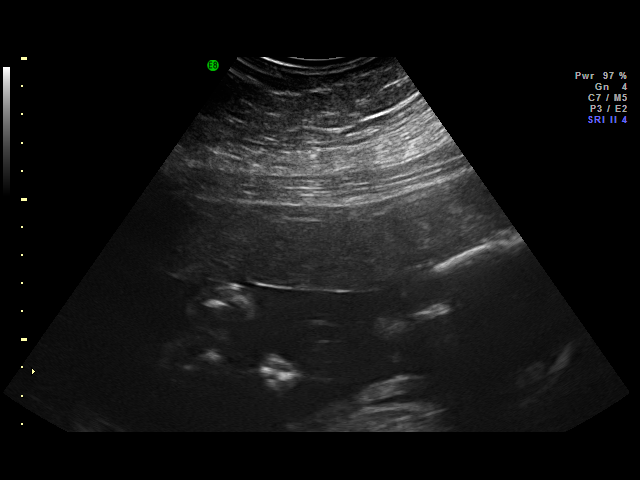
[im 13/14]
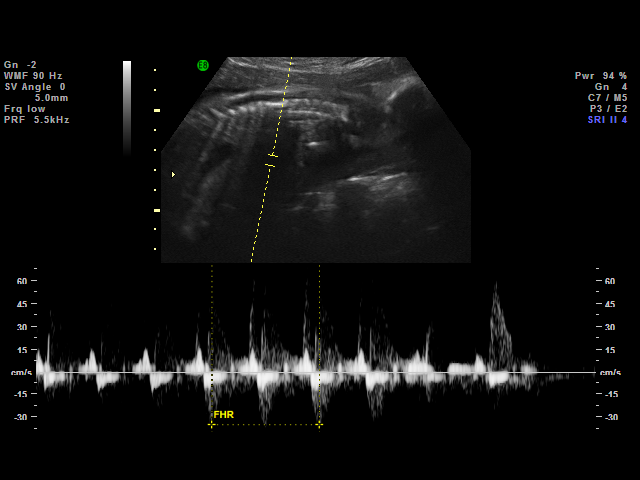
[im 14/14]
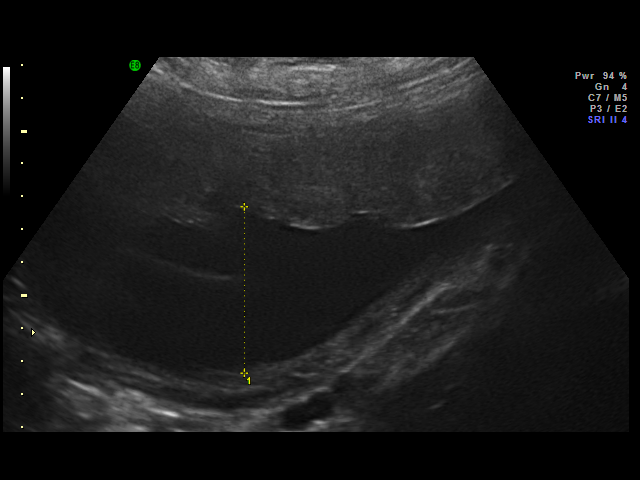

[14 of 14 positions shown; findings below may reference images not displayed]

IMPRESSION: [facility] has also been faxed to the ordering physician.

## 2013-10-07 ENCOUNTER — Encounter (HOSPITAL_BASED_OUTPATIENT_CLINIC_OR_DEPARTMENT_OTHER): Payer: Self-pay | Admitting: Emergency Medicine

## 2013-10-07 ENCOUNTER — Emergency Department (HOSPITAL_BASED_OUTPATIENT_CLINIC_OR_DEPARTMENT_OTHER)
Admission: EM | Admit: 2013-10-07 | Discharge: 2013-10-07 | Disposition: A | Discharge status: Home / Self Care | Payer: Medicaid Other | Attending: Emergency Medicine | Admitting: Emergency Medicine

## 2013-10-07 DIAGNOSIS — Z7982 Long term (current) use of aspirin: Secondary | ICD-10-CM | POA: Insufficient documentation

## 2013-10-07 DIAGNOSIS — E119 Type 2 diabetes mellitus without complications: Secondary | ICD-10-CM | POA: Diagnosis not present

## 2013-10-07 DIAGNOSIS — R21 Rash and other nonspecific skin eruption: Secondary | ICD-10-CM | POA: Diagnosis present

## 2013-10-07 DIAGNOSIS — IMO0002 Reserved for concepts with insufficient information to code with codable children: Secondary | ICD-10-CM | POA: Diagnosis not present

## 2013-10-07 DIAGNOSIS — F17200 Nicotine dependence, unspecified, uncomplicated: Secondary | ICD-10-CM | POA: Insufficient documentation

## 2013-10-07 DIAGNOSIS — Z86718 Personal history of other venous thrombosis and embolism: Secondary | ICD-10-CM | POA: Insufficient documentation

## 2013-10-07 DIAGNOSIS — Z79899 Other long term (current) drug therapy: Secondary | ICD-10-CM | POA: Diagnosis not present

## 2013-10-07 DIAGNOSIS — L509 Urticaria, unspecified: Secondary | ICD-10-CM | POA: Diagnosis not present

## 2013-10-07 MED ORDER — DIPHENHYDRAMINE HCL 25 MG PO TABS: 25.0000 mg | ORAL_TABLET | Freq: Three times a day (TID) | ORAL | Status: DC | PRN

## 2013-10-07 MED ORDER — PREDNISONE 20 MG PO TABS: 40.0000 mg | ORAL_TABLET | Freq: Every day | ORAL | Status: DC

## 2013-10-07 MED ORDER — FAMOTIDINE 20 MG PO TABS: 20.0000 mg | ORAL_TABLET | Freq: Two times a day (BID) | ORAL | Status: DC

## 2013-10-07 NOTE — ED Provider Notes (Signed)
CSN: [id number]     Arrival date & time 10/07/13  1429 History   First MD Initiated Contact with Patient 10/07/13 1441     Chief Complaint  Patient presents with  . Rash     (Consider location/radiation/quality/duration/timing/severity/associated sxs/prior Treatment) HPI  Patient reports a 2 to three-day history of worsening rash over her feet, legs, arms, and back. She notes that the rash is itchy. She took some Benadryl last night which did seem to help the itching but did not help the rash. She denies any new exposures, medications, foods, detergents, or soaps.  She denies any systemic symptoms including fever, shortness breath, wheezing.  No one else in her house has experienced similar symptoms.  Past Medical History  Diagnosis Date  . Diabetes mellitus   . History of blood clots    History reviewed. No pertinent past surgical history. No family history on file. History  Substance Use Topics  . Smoking status: Current Every Day Smoker  . Smokeless tobacco: Not on file  . Alcohol Use: No   OB History   Grav Para Term Preterm Abortions TAB SAB Ect Mult Living                 Review of Systems  Constitutional: Negative for fever.  HENT: Negative for facial swelling and trouble swallowing.   Respiratory: Negative for chest tightness and shortness of breath.   Gastrointestinal: Negative for nausea and vomiting.  Skin: Positive for rash.  All other systems reviewed and are negative.     Allergies  Review of patient's allergies indicates no known allergies.  Home Medications   Prior to Admission medications   Medication Sig Start Date End Date Taking? Authorizing Provider  aspirin EC 81 MG tablet Take 81 mg by mouth daily.    Historical Provider, MD  diphenhydrAMINE (BENADRYL) 25 MG tablet Take 1 tablet (25 mg total) by mouth every 8 (eight) hours as needed for itching. 10/07/13   Merryl Hacker, MD  famotidine (PEPCID) 20 MG tablet Take 1 tablet (20 mg total) by  mouth 2 (two) times daily. 10/07/13   Merryl Hacker, MD  metFORMIN (GLUCOPHAGE) 500 MG tablet Take 500 mg by mouth 2 (two) times daily with a meal.    Historical Provider, MD  predniSONE (DELTASONE) 20 MG tablet Take 2 tablets (40 mg total) by mouth daily. 10/07/13   Merryl Hacker, MD   BP 119/78  Pulse 95  Temp(Src) 98.1 F (36.7 C) (Oral)  Resp 18  Ht 5' 5"  (1.651 m)  Wt 256 lb (116.121 kg)  BMI 42.60 kg/m2  SpO2 99%  LMP 09/17/2013 Physical Exam  Nursing note and vitals reviewed. Constitutional: She is oriented to person, place, and time. She appears well-developed and well-nourished.  HENT:  Head: Normocephalic and atraumatic.  Mouth/Throat: Oropharynx is clear and moist.  Eyes: Pupils are equal, round, and reactive to light.  Cardiovascular: Normal rate, regular rhythm and normal heart sounds.   No murmur heard. Pulmonary/Chest: Effort normal and breath sounds normal. No respiratory distress. She has no wheezes.  Musculoskeletal: She exhibits no edema.  Neurological: She is alert and oriented to person, place, and time.  Skin: Skin is warm and dry.  Urticaria noted in the left posterior shoulder and flank, left arm, right arm, proximal right inner thigh and bilateral feet, excoriations noted over the feet, no associated erythema or induration  Psychiatric: She has a normal mood and affect.    ED Course  Procedures (  including critical care time) Labs Review Labs Reviewed - No data to display  Imaging Review No results found.   EKG Interpretation None      MDM   Final diagnoses:  Hives    Patient presents with urticarial rash.  No known exposures.  NO evidence of anaphylaxis.  Patient will be placed pepcid, benadryl, and steroids for presumed allergic component.  Patient also instructed to try OTC hydrocortisone.  Patient to f/u with PCP if rash persists.  Patient given return precautions including worsening rash, SOB, throat swelling.  Patient stated  understanding.  After history, exam, and medical workup I feel the patient has been appropriately medically screened and is safe for discharge home. Pertinent diagnoses were discussed with the patient. Patient was given return precautions.     Merryl Hacker, MD 10/07/13 2201

## 2013-10-07 NOTE — ED Notes (Signed)
Pt reports rash to arms, legs and back x 3 days. No new detergents.

## 2013-10-07 NOTE — Discharge Instructions (Signed)
Hives Hives are itchy, red, swollen areas of the skin. They can vary in size and location on your body. Hives can come and go for hours or several days (acute hives) or for several weeks (chronic hives). Hives do not spread from person to person (noncontagious). They may get worse with scratching, exercise, and emotional stress. CAUSES   Allergic reaction to food, additives, or drugs.  Infections, including the common cold.  Illness, such as vasculitis, lupus, or thyroid disease.  Exposure to sunlight, heat, or cold.  Exercise.  Stress.  Contact with chemicals. SYMPTOMS   Red or white swollen patches on the skin. The patches may change size, shape, and location quickly and repeatedly.  Itching.  Swelling of the hands, feet, and face. This may occur if hives develop deeper in the skin. DIAGNOSIS  Your caregiver can usually tell what is wrong by performing a physical exam. Skin or blood tests may also be done to determine the cause of your hives. In some cases, the cause cannot be determined. TREATMENT  Mild cases usually get better with medicines such as antihistamines. Severe cases may require an emergency epinephrine injection. If the cause of your hives is known, treatment includes avoiding that trigger.  HOME CARE INSTRUCTIONS   Avoid causes that trigger your hives.  Take antihistamines as directed by your caregiver to reduce the severity of your hives. Non-sedating or low-sedating antihistamines are usually recommended. Do not drive while taking an antihistamine.  Take any other medicines prescribed for itching as directed by your caregiver.  Wear loose-fitting clothing.  Keep all follow-up appointments as directed by your caregiver. SEEK MEDICAL CARE IF:   You have persistent or severe itching that is not relieved with medicine.  You have painful or swollen joints. SEEK IMMEDIATE MEDICAL CARE IF:   You have a fever.  Your tongue or lips are swollen.  You have  trouble breathing or swallowing.  You feel tightness in the throat or chest.  You have abdominal pain. These problems may be the first sign of a life-threatening allergic reaction. Call your local emergency services (911 in [place] MAKE SURE YOU:   Understand these instructions.  Will watch your condition.  Will get help right away if you are not doing well or get worse. Document Released: 01/20/2005 Document Revised: 01/25/2013 Document Reviewed: 04/15/2011 [employer] Patient Information 2015 [employer]. This information is not intended to replace advice given to you by your health care provider. Make sure you discuss any questions you have with your health care provider.  

## 2014-04-18 ENCOUNTER — Encounter (HOSPITAL_BASED_OUTPATIENT_CLINIC_OR_DEPARTMENT_OTHER): Payer: Self-pay | Admitting: *Deleted

## 2014-04-18 ENCOUNTER — Emergency Department (HOSPITAL_BASED_OUTPATIENT_CLINIC_OR_DEPARTMENT_OTHER)
Admission: EM | Admit: 2014-04-18 | Discharge: 2014-04-18 | Disposition: A | Discharge status: Home / Self Care | Payer: Medicaid Other | Attending: Emergency Medicine | Admitting: Emergency Medicine

## 2014-04-18 DIAGNOSIS — R102 Pelvic and perineal pain: Secondary | ICD-10-CM | POA: Diagnosis present

## 2014-04-18 DIAGNOSIS — E119 Type 2 diabetes mellitus without complications: Secondary | ICD-10-CM | POA: Diagnosis not present

## 2014-04-18 DIAGNOSIS — Z7952 Long term (current) use of systemic steroids: Secondary | ICD-10-CM | POA: Insufficient documentation

## 2014-04-18 DIAGNOSIS — N8110 Cystocele, unspecified: Secondary | ICD-10-CM | POA: Diagnosis not present

## 2014-04-18 DIAGNOSIS — N898 Other specified noninflammatory disorders of vagina: Secondary | ICD-10-CM | POA: Insufficient documentation

## 2014-04-18 DIAGNOSIS — Z7901 Long term (current) use of anticoagulants: Secondary | ICD-10-CM | POA: Diagnosis not present

## 2014-04-18 DIAGNOSIS — Z720 Tobacco use: Secondary | ICD-10-CM | POA: Diagnosis not present

## 2014-04-18 DIAGNOSIS — Z3202 Encounter for pregnancy test, result negative: Secondary | ICD-10-CM | POA: Insufficient documentation

## 2014-04-18 DIAGNOSIS — Z7982 Long term (current) use of aspirin: Secondary | ICD-10-CM | POA: Diagnosis not present

## 2014-04-18 DIAGNOSIS — Z79899 Other long term (current) drug therapy: Secondary | ICD-10-CM | POA: Diagnosis not present

## 2014-04-18 DIAGNOSIS — Z86718 Personal history of other venous thrombosis and embolism: Secondary | ICD-10-CM | POA: Diagnosis not present

## 2014-04-18 DIAGNOSIS — IMO0002 Reserved for concepts with insufficient information to code with codable children: Secondary | ICD-10-CM

## 2014-04-18 LAB — URINALYSIS, ROUTINE W REFLEX MICROSCOPIC
Bilirubin Urine: NEGATIVE
GLUCOSE, UA: NEGATIVE mg/dL
KETONES UR: 40 mg/dL — AB
NITRITE: NEGATIVE
PROTEIN: 30 mg/dL — AB
Specific Gravity, Urine: 1.015 (ref 1.005–1.030)
Urobilinogen, UA: 0.2 mg/dL (ref 0.0–1.0)
pH: 6.5 (ref 5.0–8.0)

## 2014-04-18 LAB — URINE MICROSCOPIC-ADD ON

## 2014-04-18 LAB — WET PREP, GENITAL
Trich, Wet Prep: NONE SEEN
Yeast Wet Prep HPF POC: NONE SEEN

## 2014-04-18 LAB — PREGNANCY, URINE: Preg Test, Ur: NEGATIVE

## 2014-04-18 MED ORDER — OXYCODONE-ACETAMINOPHEN 5-325 MG PO TABS: 1.0000 | ORAL_TABLET | Freq: Four times a day (QID) | ORAL | Status: DC | PRN

## 2014-04-18 MED ORDER — HYDROMORPHONE HCL 1 MG/ML IJ SOLN
1.0000 mg | Freq: Once | INTRAMUSCULAR | Status: AC
  Administered 2014-04-18: 1 mg via INTRAVENOUS
  Filled 2014-04-18: qty 1

## 2014-04-18 MED ORDER — OXYCODONE-ACETAMINOPHEN 5-325 MG PO TABS
1.0000 | ORAL_TABLET | Freq: Once | ORAL | Status: AC
  Administered 2014-04-18: 1 via ORAL
  Filled 2014-04-18: qty 1

## 2014-04-18 MED ORDER — ONDANSETRON HCL 4 MG/2ML IJ SOLN
4.0000 mg | Freq: Once | INTRAMUSCULAR | Status: AC
  Administered 2014-04-18: 4 mg via INTRAVENOUS
  Filled 2014-04-18: qty 2

## 2014-04-18 NOTE — ED Provider Notes (Signed)
CSN: [id number]     Arrival date & time 04/18/14  1828 History   First MD Initiated Contact with Patient 04/18/14 1836     Chief Complaint  Patient presents with  . Rectal Pain  . Vaginal Pain     (Consider location/radiation/quality/duration/timing/severity/associated sxs/prior Treatment) HPI Comments: Patient presents with complaint of groin and vaginal pain starting this morning, acutely. No reported injury leading to pain. Patient states that she feels as though her bladder has shifted. Pain is sharp and nonradiating. She has not had pain like this in the past. No urinary symptoms and she has urinated since the pain began. No vaginal bleeding. Patient took a laxative because she was having gas pains and this did not help. She denies fever, nausea or vomiting. No history of abdominal or pelvic surgeries. Course is constant. Nothing makes symptoms better or worse. Patient has had one child.  Patient is a 44 y.o. female presenting with vaginal pain. The history is provided by the patient.  Vaginal Pain Pertinent negatives include no abdominal pain, chest pain, coughing, fever, headaches, myalgias, nausea, rash, sore throat or vomiting.    Past Medical History  Diagnosis Date  . Diabetes mellitus   . History of blood clots    Past Surgical History  Procedure Laterality Date  . Vascular surgery     No family history on file. History  Substance Use Topics  . Smoking status: Current Some Day Smoker -- 0.00 packs/day  . Smokeless tobacco: Not on file  . Alcohol Use: No   OB History    No data available     Review of Systems  Constitutional: Negative for fever.  HENT: Negative for rhinorrhea and sore throat.   Eyes: Negative for redness.  Respiratory: Negative for cough.   Cardiovascular: Negative for chest pain.  Gastrointestinal: Positive for rectal pain. Negative for nausea, vomiting, abdominal pain, diarrhea and blood in stool.  Genitourinary: Positive for vaginal pain and  pelvic pain. Negative for dysuria.  Musculoskeletal: Negative for myalgias.  Skin: Negative for rash.  Neurological: Negative for headaches.      Allergies  Review of patient's allergies indicates no known allergies.  Home Medications   Prior to Admission medications   Medication Sig Start Date End Date Taking? Authorizing Provider  PRAVASTATIN SODIUM PO Take by mouth.   Yes Historical Provider, MD  RANITIDINE HCL PO Take by mouth.   Yes Historical Provider, MD  Warfarin Sodium (COUMADIN PO) Take by mouth.   Yes Historical Provider, MD  aspirin EC 81 MG tablet Take 81 mg by mouth daily.    Historical Provider, MD  diphenhydrAMINE (BENADRYL) 25 MG tablet Take 1 tablet (25 mg total) by mouth every 8 (eight) hours as needed for itching. 10/07/13   Merryl Hacker, MD  famotidine (PEPCID) 20 MG tablet Take 1 tablet (20 mg total) by mouth 2 (two) times daily. 10/07/13   Merryl Hacker, MD  metFORMIN (GLUCOPHAGE) 500 MG tablet Take 500 mg by mouth 2 (two) times daily with a meal.    Historical Provider, MD  predniSONE (DELTASONE) 20 MG tablet Take 2 tablets (40 mg total) by mouth daily. 10/07/13   Merryl Hacker, MD   BP 134/78 mmHg  Pulse 98  Temp(Src) 98.1 F (36.7 C) (Oral)  Resp 20  Ht 5' 6"  (1.676 m)  Wt 260 lb (117.935 kg)  BMI 41.99 kg/m2  SpO2 100%  LMP 03/24/2014   Physical Exam  Constitutional: She appears well-developed and well-nourished.  HENT:  Head: Normocephalic and atraumatic.  Eyes: Conjunctivae are normal. Right eye exhibits no discharge. Left eye exhibits no discharge.  Neck: Normal range of motion. Neck supple.  Cardiovascular: Normal rate, regular rhythm and normal heart sounds.   Pulmonary/Chest: Effort normal and breath sounds normal.  Abdominal: Soft. There is no tenderness.  Genitourinary: Rectum normal. Uterus is not tender. Cervix exhibits no motion tenderness and no discharge. Right adnexum displays no mass and no tenderness. Left adnexum displays  no mass and no tenderness. There is tenderness in the vagina. No erythema or bleeding in the vagina. No foreign body around the vagina. No signs of injury around the vagina. Vaginal discharge (white) found.  Patient with anterior prolapse, suspect cystocele. Anterior bulging is tender to palpation.   Neurological: She is alert.  Skin: Skin is warm and dry.  Psychiatric: She has a normal mood and affect.  Nursing note and vitals reviewed.   ED Course  Procedures (including critical care time) Labs Review Labs Reviewed  WET PREP, GENITAL - Abnormal; Notable for the following:    Clue Cells Wet Prep HPF POC FEW (*)    WBC, Wet Prep HPF POC FEW (*)    All other components within normal limits  URINALYSIS, ROUTINE W REFLEX MICROSCOPIC - Abnormal; Notable for the following:    APPearance CLOUDY (*)    Hgb urine dipstick LARGE (*)    Ketones, ur 40 (*)    Protein, ur 30 (*)    Leukocytes, UA MODERATE (*)    All other components within normal limits  URINE MICROSCOPIC-ADD ON - Abnormal; Notable for the following:    Squamous Epithelial / LPF FEW (*)    Bacteria, UA FEW (*)    All other components within normal limits  PREGNANCY, URINE  GC/CHLAMYDIA PROBE AMP ([place])    Imaging Review No results found.   EKG Interpretation None      7:45 PM Patient seen and examined. She is very uncomfortable. Will need to give pain medication prior to performing genital exam.   Vital signs reviewed and are as follows: BP 134/78 mmHg  Pulse 98  Temp(Src) 98.1 F (36.7 C) (Oral)  Resp 20  Ht 5' 6"  (1.676 m)  Wt 260 lb (117.935 kg)  BMI 41.99 kg/m2  SpO2 100%  LMP 03/24/2014  8:41 PM Pelvic exam performed with nurse chaperone. Additional pain medication ordered. Patient discussed with Dr. Mingo Amber. Symptomatic control and GYN follow-up is indicated. Patient sees [facility].   9:54 PM patient's pain is well controlled. Will discharge patient to home with rx of Percocet. She will  call her GYN tomorrow for follow-up.  Patient counseled on use of narcotic pain medications. Counseled not to combine these medications with others containing tylenol. Urged not to drink alcohol, drive, or perform any other activities that requires focus while taking these medications. The patient verbalizes understanding and agrees with the plan.  Patient urged to return with worsening symptoms or other concerns. Patient verbalized understanding and agrees with plan.     MDM   Final diagnoses:  Cystocele   Patient with exam and pain consistent with a cystocele. No urinary retention. No other concerning findings at this time. Patient does have blood in her urine. Of note she is on Coumadin. Do not suspect significant blood loss. Pain control with outpatient GYN follow-up is indicated at this time. No rectal pathology suspected.    Carlisle Cater, PA-C 04/18/14 2155  Evelina Bucy, MD 04/19/14 [phone]

## 2014-04-18 NOTE — ED Notes (Signed)
States she has over active bladder and is having spasms in her rectum and vagina. Crying at triage.

## 2014-04-18 NOTE — Discharge Instructions (Signed)
Please read and follow all provided instructions.  Your diagnoses today include:  1. Cystocele    Tests performed today include:  Urine test - blood in urine but no definite infection  Wet prep - no vaginal infection  Vital signs. See below for your results today.   Medications prescribed:   Percocet (oxycodone/acetaminophen) - narcotic pain medication  DO NOT drive or perform any activities that require you to be awake and alert because this medicine can make you drowsy. BE VERY CAREFUL not to take multiple medicines containing Tylenol (also called acetaminophen). Doing so can lead to an overdose which can damage your liver and cause liver failure and possibly death.  Take any prescribed medications only as directed.  Home care instructions:  Follow any educational materials contained in this packet.  BE VERY CAREFUL not to take multiple medicines containing Tylenol (also called acetaminophen). Doing so can lead to an overdose which can damage your liver and cause liver failure and possibly death.   Follow-up instructions: Please follow-up with your gynecologist in the next 5 days for further evaluation of your symptoms.   Return instructions:   Please return to the Emergency Department if you experience worsening symptoms.   Return with uncontrolled pain.   Please return if you have any other emergent concerns.  Additional Information:  Your vital signs today were: BP 116/61 mmHg   Pulse 71   Temp(Src) 98.1 F (36.7 C) (Oral)   Resp 16   Ht 5' 6"  (1.676 m)   Wt 260 lb (117.935 kg)   BMI 41.99 kg/m2   SpO2 100%   LMP 03/24/2014 If your blood pressure (BP) was elevated above 135/85 this visit, please have this repeated by your doctor within one month. --------------

## 2014-04-19 LAB — GC/CHLAMYDIA PROBE AMP (~~LOC~~) NOT AT ARMC
Chlamydia: NEGATIVE
Neisseria Gonorrhea: NEGATIVE

## 2014-05-06 ENCOUNTER — Inpatient Hospital Stay (HOSPITAL_COMMUNITY)
Admission: AD | Admit: 2014-05-06 | Discharge: 2014-05-06 | Disposition: A | Discharge status: Home / Self Care | Payer: Medicaid Other | Source: Ambulatory Visit | Attending: Obstetrics and Gynecology | Admitting: Obstetrics and Gynecology

## 2014-05-06 ENCOUNTER — Inpatient Hospital Stay (HOSPITAL_COMMUNITY): Payer: Medicaid Other

## 2014-05-06 ENCOUNTER — Encounter (HOSPITAL_COMMUNITY): Payer: Self-pay | Admitting: *Deleted

## 2014-05-06 DIAGNOSIS — F17200 Nicotine dependence, unspecified, uncomplicated: Secondary | ICD-10-CM | POA: Insufficient documentation

## 2014-05-06 DIAGNOSIS — N939 Abnormal uterine and vaginal bleeding, unspecified: Secondary | ICD-10-CM

## 2014-05-06 DIAGNOSIS — R102 Pelvic and perineal pain: Secondary | ICD-10-CM | POA: Diagnosis not present

## 2014-05-06 DIAGNOSIS — N361 Urethral diverticulum: Secondary | ICD-10-CM | POA: Insufficient documentation

## 2014-05-06 LAB — CBC
HCT: 36.6 % (ref 36.0–46.0)
Hemoglobin: 12.3 g/dL (ref 12.0–15.0)
MCH: 30.9 pg (ref 26.0–34.0)
MCHC: 33.6 g/dL (ref 30.0–36.0)
MCV: 92 fL (ref 78.0–100.0)
PLATELETS: 308 10*3/uL (ref 150–400)
RBC: 3.98 MIL/uL (ref 3.87–5.11)
RDW: 14.4 % (ref 11.5–15.5)
WBC: 9.9 10*3/uL (ref 4.0–10.5)

## 2014-05-06 LAB — URINALYSIS, ROUTINE W REFLEX MICROSCOPIC
BILIRUBIN URINE: NEGATIVE
GLUCOSE, UA: NEGATIVE mg/dL
KETONES UR: 15 mg/dL — AB
NITRITE: NEGATIVE
Protein, ur: NEGATIVE mg/dL
Specific Gravity, Urine: 1.02 (ref 1.005–1.030)
UROBILINOGEN UA: 0.2 mg/dL (ref 0.0–1.0)
pH: 7 (ref 5.0–8.0)

## 2014-05-06 LAB — URINE MICROSCOPIC-ADD ON

## 2014-05-06 LAB — WET PREP, GENITAL
TRICH WET PREP: NONE SEEN
Yeast Wet Prep HPF POC: NONE SEEN

## 2014-05-06 LAB — POCT PREGNANCY, URINE: PREG TEST UR: NEGATIVE

## 2014-05-06 MED ORDER — OXYCODONE-ACETAMINOPHEN 5-325 MG PO TABS: 2.0000 | ORAL_TABLET | ORAL | Status: DC | PRN

## 2014-05-06 NOTE — MAU Note (Addendum)
Pt has history of cyst on top of her bladder. Started having abd pain several weks ago went to GYN and was referred to Urologist. Was told the cyst was very swollen and starting to come through her bladder. Given antibiotics and antiinflammatory meds to shrink it. She started to have some vag bleeding so was told to stop the antiinflammatory medicine. She stopped and did not have any bleeding yesterday. Took a shower today and had heavy vag bleeding that filled up a pad and is continuing. Having occasional mild pain with the bleeding.

## 2014-05-06 NOTE — MAU Provider Note (Signed)
History     CSN: [id number]  Arrival date and time: 05/06/14 1519   None     Chief Complaint  Patient presents with  . 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   Pt saw Dr. Ouida Sills on 04/19/2014 and was told to follow up with Dr. McDiarmid- pt has appointment on 06/02/2014 Pt started spotting 3 days ago and was told to stop Mobic since it was thought that might be contributing to her spotting Today pt started having bleeding- one pad.  Pt's LMP 04/22/2014 was normal period Pt's pain level is tolerable at 4/10 and does not want anything for the pain Pt is confused if bleeding is coming from diverticulum or her uterus. Pt has not had intercourse in quite a while Pt has been under much stress moving aunt and cousin as well as taking care of 55 year old son- pt thinks her rectal pain could have come from pressure lifting items RN note: Ronnie Derby, RN Registered Nurse Addendum  MAU Note 05/06/2014 3:43 PM    Expand All Collapse All   Pt has history of cyst on top of her bladder. Started having abd pain several weks ago went to GYN and was referred to Urologist. Was told the cyst was very swollen and starting to come through her bladder. Given antibiotics and antiinflammatory meds to shrink it. She started to have some vag bleeding so was told to stop the antiinflammatory medicine. She stopped and did not have any bleeding yesterday. Took a shower today and had heavy vag bleeding that filled up a pad and is continuing. Having occasional mild pain with the bleeding.         Past Medical History  Diagnosis Date  . Diabetes mellitus   . History of blood clots     Past Surgical History   Procedure Laterality Date  . Vascular surgery      History reviewed. No pertinent family history.  History  Substance Use Topics  . Smoking status: Current Some Day Smoker -- 0.00 packs/day  . Smokeless tobacco: Not on file  . Alcohol Use: No    Allergies: No Known Allergies  Prescriptions prior to admission  Medication Sig Dispense Refill Last Dose  . aspirin EC 81 MG tablet Take 81 mg by mouth daily.   06/24/11  . diphenhydrAMINE (BENADRYL) 25 MG tablet Take 1 tablet (25 mg total) by mouth every 8 (eight) hours as needed for itching. 20 tablet 0   . famotidine (PEPCID) 20 MG tablet Take 1 tablet (20 mg total) by mouth 2 (two) times daily. 30 tablet 0   . metFORMIN (GLUCOPHAGE) 500 MG tablet Take 500 mg by mouth 2 (two) times daily with a meal.   07/08/2011 at Unknown  . oxyCODONE-acetaminophen (PERCOCET/ROXICET) 5-325 MG per tablet Take 1-2 tablets by mouth every 6 (six) hours as needed for severe pain. 12 tablet 0   . PRAVASTATIN SODIUM PO Take by mouth.     . predniSONE (DELTASONE) 20 MG tablet Take 2 tablets (40 mg total) by mouth daily. 10 tablet 0   . RANITIDINE HCL PO Take by mouth.     . Warfarin Sodium (COUMADIN PO) Take  by mouth.       Review of Systems  Constitutional: Negative for fever and chills.  Gastrointestinal: Negative for nausea, vomiting, abdominal pain and diarrhea.  Genitourinary: Negative for dysuria, urgency and frequency.   Physical Exam   Blood pressure 133/85, pulse 77, temperature 98.4 F (36.9 C), temperature source Oral, resp. rate 18, height 5' 6"  (1.676 m), weight 263 lb (119.296 kg), last menstrual period 04/22/2014.  Physical Exam  Nursing note and vitals reviewed. Constitutional: She appears well-developed and well-nourished. No distress.  HENT:  Head: Normocephalic.  Eyes: Pupils are equal, round, and reactive to light.  Neck: Normal range of motion. Neck supple.  Cardiovascular: Normal rate.   Respiratory: Effort normal.  GI: Soft.   Genitourinary:  BUS - negative- small amount of dark red blood from cervix; no urethral bleeding noted; ant vagina/presumably urethral diverticulum tender to touch with bimanual; uterus and adnexa NSSC NT without appreciable enlargement    MAU Course  Procedures  Results for orders placed or performed during the hospital encounter of 05/06/14 (from the past 48 hour(s))  Urinalysis, Routine w reflex microscopic     Status: Abnormal   Collection Time: 05/06/14  3:40 PM  Result Value Ref Range   Color, Urine YELLOW YELLOW   APPearance CLEAR CLEAR   Specific Gravity, Urine 1.020 1.005 - 1.030   pH 7.0 5.0 - 8.0   Glucose, UA NEGATIVE NEGATIVE mg/dL   Hgb urine dipstick LARGE (A) NEGATIVE   Bilirubin Urine NEGATIVE NEGATIVE   Ketones, ur 15 (A) NEGATIVE mg/dL   Protein, ur NEGATIVE NEGATIVE mg/dL   Urobilinogen, UA 0.2 0.0 - 1.0 mg/dL   Nitrite NEGATIVE NEGATIVE   Leukocytes, UA SMALL (A) NEGATIVE  Urine microscopic-add on     Status: None   Collection Time: 05/06/14  3:40 PM  Result Value Ref Range   Squamous Epithelial / LPF RARE RARE   WBC, UA 0-2 <3 WBC/hpf   RBC / HPF 7-10 <3 RBC/hpf   Bacteria, UA RARE RARE   Urine-Other MUCOUS PRESENT   Pregnancy, urine POC     Status: None   Collection Time: 05/06/14  3:45 PM  Result Value Ref Range   Preg Test, Ur NEGATIVE NEGATIVE    Comment:        THE SENSITIVITY OF THIS METHODOLOGY IS >24 mIU/mL   Wet prep, genital     Status: Abnormal   Collection Time: 05/06/14  5:03 PM  Result Value Ref Range   Yeast Wet Prep HPF POC NONE SEEN NONE SEEN   Trich, Wet Prep NONE SEEN NONE SEEN   Clue Cells Wet Prep HPF POC FEW (A) NONE SEEN   WBC, Wet Prep HPF POC MODERATE (A) NONE SEEN    Comment: MODERATE BACTERIA SEEN  US Transvaginal Non-ob  05/06/2014   CLINICAL DATA:  Abnormal pelvic bleeding. Mid pelvic pain. The patient is on warfarin for DVT.  EXAM: TRANSABDOMINAL AND TRANSVAGINAL ULTRASOUND OF PELVIS  TECHNIQUE: Both transabdominal  and transvaginal ultrasound examinations of the pelvis were performed. Transabdominal technique was performed for global imaging of the pelvis including uterus, ovaries, adnexal regions, and pelvic cul-de-sac. It was necessary to proceed with endovaginal exam following the transabdominal exam to visualize the ovaries.  COMPARISON:  Pelvic MRI dated 05/02/2011  FINDINGS: Uterus  Measurements: 7.9 by 4.1 by 5.1 cm. No fibroids or other mass visualized.  Endometrium  Thickness: 8 mm.  No focal abnormality visualized.  Right ovary  Measurements: 5.4 by 2.8 by 3.6  cm (volume 28 mL). 2.2 cm simple appearing cyst.  Left ovary  Measurements: 5.2 by 2.7 by 3.0 cm (volume 22 mL). Normal appearance/no adnexal mass.  Other findings  No free fluid.  IMPRESSION: 1. Both ovaries are mildly enlarged but this is similar to 05/02/2011. We do not demonstrate numerous peripheral follicles to suggest polycystic ovary disease. 2. Small simple appearing right ovarian cyst.   Electronically Signed   By: Van Clines M.D.   On: 05/06/2014 17:53   US Pelvis Complete  05/06/2014   CLINICAL DATA:  Abnormal pelvic bleeding. Mid pelvic pain. The patient is on warfarin for DVT.  EXAM: TRANSABDOMINAL AND TRANSVAGINAL ULTRASOUND OF PELVIS  TECHNIQUE: Both transabdominal and transvaginal ultrasound examinations of the pelvis were performed. Transabdominal technique was performed for global imaging of the pelvis including uterus, ovaries, adnexal regions, and pelvic cul-de-sac. It was necessary to proceed with endovaginal exam following the transabdominal exam to visualize the ovaries.  COMPARISON:  Pelvic MRI dated 05/02/2011  FINDINGS: Uterus  Measurements: 7.9 by 4.1 by 5.1 cm. No fibroids or other mass visualized.  Endometrium  Thickness: 8 mm.  No focal abnormality visualized.  Right ovary  Measurements: 5.4 by 2.8 by 3.6 cm (volume 28 mL). 2.2 cm simple appearing cyst.  Left ovary  Measurements: 5.2 by 2.7 by 3.0 cm (volume 22 mL).  Normal appearance/no adnexal mass.  Other findings  No free fluid.  IMPRESSION: 1. Both ovaries are mildly enlarged but this is similar to 05/02/2011. We do not demonstrate numerous peripheral follicles to suggest polycystic ovary disease. 2. Small simple appearing right ovarian cyst.   Electronically Signed   By: Van Clines M.D.   On: 05/06/2014 17:53  CBC pending Discussed with Dr. Rogue Bussing- pt to f/u with Dr. McDiarmid and then Dr. Ouida Sills Assessment and Plan  Abnormal uterine bleeding- f/u with Dr. Ouida Sills Urethral diverticulum- f/u with dr. Nicki Reaper McDiarmid   Sherolyn Buba 05/06/2014, 4:03 PM

## 2014-05-08 LAB — GC/CHLAMYDIA PROBE AMP (~~LOC~~) NOT AT ARMC
Chlamydia: NEGATIVE
Neisseria Gonorrhea: NEGATIVE

## 2014-05-15 ENCOUNTER — Other Ambulatory Visit (HOSPITAL_COMMUNITY): Payer: Self-pay | Admitting: Obstetrics and Gynecology

## 2014-05-15 DIAGNOSIS — R9389 Abnormal findings on diagnostic imaging of other specified body structures: Secondary | ICD-10-CM

## 2014-05-18 ENCOUNTER — Ambulatory Visit (HOSPITAL_COMMUNITY)
Admission: RE | Admit: 2014-05-18 | Discharge: 2014-05-18 | Disposition: A | Discharge status: Home / Self Care | Payer: Medicaid Other | Source: Ambulatory Visit | Attending: Obstetrics and Gynecology | Admitting: Obstetrics and Gynecology

## 2014-05-18 DIAGNOSIS — R938 Abnormal findings on diagnostic imaging of other specified body structures: Secondary | ICD-10-CM | POA: Diagnosis not present

## 2014-05-18 DIAGNOSIS — R9389 Abnormal findings on diagnostic imaging of other specified body structures: Secondary | ICD-10-CM

## 2014-05-18 DIAGNOSIS — N939 Abnormal uterine and vaginal bleeding, unspecified: Secondary | ICD-10-CM | POA: Diagnosis present

## 2015-03-01 ENCOUNTER — Encounter (HOSPITAL_BASED_OUTPATIENT_CLINIC_OR_DEPARTMENT_OTHER): Payer: Self-pay | Admitting: *Deleted

## 2015-03-01 ENCOUNTER — Observation Stay (HOSPITAL_BASED_OUTPATIENT_CLINIC_OR_DEPARTMENT_OTHER)
Admission: EM | Admit: 2015-03-01 | Discharge: 2015-03-02 | Disposition: A | Discharge status: Home / Self Care | Payer: Medicaid Other | Attending: Internal Medicine | Admitting: Internal Medicine

## 2015-03-01 ENCOUNTER — Emergency Department (HOSPITAL_BASED_OUTPATIENT_CLINIC_OR_DEPARTMENT_OTHER): Payer: Medicaid Other

## 2015-03-01 DIAGNOSIS — Z79899 Other long term (current) drug therapy: Secondary | ICD-10-CM | POA: Insufficient documentation

## 2015-03-01 DIAGNOSIS — Z7984 Long term (current) use of oral hypoglycemic drugs: Secondary | ICD-10-CM | POA: Diagnosis not present

## 2015-03-01 DIAGNOSIS — K219 Gastro-esophageal reflux disease without esophagitis: Secondary | ICD-10-CM | POA: Insufficient documentation

## 2015-03-01 DIAGNOSIS — E7800 Pure hypercholesterolemia, unspecified: Secondary | ICD-10-CM | POA: Insufficient documentation

## 2015-03-01 DIAGNOSIS — D6859 Other primary thrombophilia: Secondary | ICD-10-CM | POA: Insufficient documentation

## 2015-03-01 DIAGNOSIS — R079 Chest pain, unspecified: Principal | ICD-10-CM | POA: Insufficient documentation

## 2015-03-01 DIAGNOSIS — Z8249 Family history of ischemic heart disease and other diseases of the circulatory system: Secondary | ICD-10-CM | POA: Insufficient documentation

## 2015-03-01 DIAGNOSIS — Z86718 Personal history of other venous thrombosis and embolism: Secondary | ICD-10-CM | POA: Insufficient documentation

## 2015-03-01 DIAGNOSIS — Z7901 Long term (current) use of anticoagulants: Secondary | ICD-10-CM | POA: Insufficient documentation

## 2015-03-01 DIAGNOSIS — Z791 Long term (current) use of non-steroidal anti-inflammatories (NSAID): Secondary | ICD-10-CM | POA: Diagnosis not present

## 2015-03-01 DIAGNOSIS — Z86711 Personal history of pulmonary embolism: Secondary | ICD-10-CM | POA: Insufficient documentation

## 2015-03-01 DIAGNOSIS — Z792 Long term (current) use of antibiotics: Secondary | ICD-10-CM | POA: Insufficient documentation

## 2015-03-01 DIAGNOSIS — F17200 Nicotine dependence, unspecified, uncomplicated: Secondary | ICD-10-CM | POA: Diagnosis not present

## 2015-03-01 DIAGNOSIS — E119 Type 2 diabetes mellitus without complications: Secondary | ICD-10-CM | POA: Insufficient documentation

## 2015-03-01 DIAGNOSIS — Z7982 Long term (current) use of aspirin: Secondary | ICD-10-CM | POA: Insufficient documentation

## 2015-03-01 LAB — COMPREHENSIVE METABOLIC PANEL
ALT: 17 U/L (ref 14–54)
ANION GAP: 7 (ref 5–15)
AST: 18 U/L (ref 15–41)
Albumin: 3.5 g/dL (ref 3.5–5.0)
Alkaline Phosphatase: 67 U/L (ref 38–126)
BILIRUBIN TOTAL: 0.6 mg/dL (ref 0.3–1.2)
BUN: 11 mg/dL (ref 6–20)
CALCIUM: 8.6 mg/dL — AB (ref 8.9–10.3)
CO2: 24 mmol/L (ref 22–32)
Chloride: 104 mmol/L (ref 101–111)
Creatinine, Ser: 0.73 mg/dL (ref 0.44–1.00)
GFR calc Af Amer: >60 mL/min (ref >60)
Glucose, Bld: 258 mg/dL — ABNORMAL HIGH (ref 65–99)
POTASSIUM: 4.1 mmol/L (ref 3.5–5.1)
Sodium: 135 mmol/L (ref 135–145)
TOTAL PROTEIN: 6.5 g/dL (ref 6.5–8.1)

## 2015-03-01 LAB — URINALYSIS, ROUTINE W REFLEX MICROSCOPIC
BILIRUBIN URINE: NEGATIVE
Glucose, UA: 500 mg/dL — AB
Ketones, ur: 15 mg/dL — AB
Leukocytes, UA: NEGATIVE
NITRITE: NEGATIVE
PROTEIN: NEGATIVE mg/dL
SPECIFIC GRAVITY, URINE: 1.027 (ref 1.005–1.030)
pH: 7.5 (ref 5.0–8.0)

## 2015-03-01 LAB — CBC WITH DIFFERENTIAL/PLATELET
BASOS PCT: 0 %
Basophils Absolute: 0 10*3/uL (ref 0.0–0.1)
EOS ABS: 0.1 10*3/uL (ref 0.0–0.7)
Eosinophils Relative: 1 %
HCT: 41.2 % (ref 36.0–46.0)
Hemoglobin: 13.8 g/dL (ref 12.0–15.0)
Lymphocytes Relative: 39 %
Lymphs Abs: 3.2 10*3/uL (ref 0.7–4.0)
MCH: 30.5 pg (ref 26.0–34.0)
MCHC: 33.5 g/dL (ref 30.0–36.0)
MCV: 91.2 fL (ref 78.0–100.0)
MONO ABS: 0.4 10*3/uL (ref 0.1–1.0)
MONOS PCT: 5 %
Neutro Abs: 4.4 10*3/uL (ref 1.7–7.7)
Neutrophils Relative %: 55 %
PLATELETS: 299 10*3/uL (ref 150–400)
RBC: 4.52 MIL/uL (ref 3.87–5.11)
RDW: 13.4 % (ref 11.5–15.5)
WBC: 8 10*3/uL (ref 4.0–10.5)

## 2015-03-01 LAB — URINE MICROSCOPIC-ADD ON: WBC, UA: NONE SEEN WBC/hpf (ref 0–5)

## 2015-03-01 LAB — TROPONIN I: Troponin I: <0.03 ng/mL (ref <0.031)

## 2015-03-01 LAB — PREGNANCY, URINE: PREG TEST UR: NEGATIVE

## 2015-03-01 LAB — D-DIMER, QUANTITATIVE (NOT AT ARMC)

## 2015-03-01 MED ORDER — ASPIRIN 81 MG PO CHEW
324.0000 mg | CHEWABLE_TABLET | Freq: Once | ORAL | Status: AC
  Administered 2015-03-01: 324 mg via ORAL
  Filled 2015-03-01: qty 4

## 2015-03-01 MED ORDER — NITROGLYCERIN 0.4 MG SL SUBL
0.4000 mg | SUBLINGUAL_TABLET | SUBLINGUAL | Status: DC | PRN
  Administered 2015-03-01 (×2): 0.4 mg via SUBLINGUAL
  Filled 2015-03-01: qty 1

## 2015-03-01 NOTE — ED Notes (Signed)
MD at bedside. 

## 2015-03-01 NOTE — ED Notes (Signed)
Chest pain with radiation into her left neck. She is crying at triage. States she has been under so much stress.

## 2015-03-01 NOTE — Progress Notes (Signed)
Transfer from [facility]  45 year old lady with past medical history of hyperlipidemia, diabetes mellitus, GERD, tobacco abuse, pulmonary embolism on Coumadin, who presents with chest pain.   Initial troponin negative, d-dimer negative, urinalysis negative, temperature normal, no tachycardia, electrolyte and renal function okay, negative chest x-ray for acute abnormalities. INR is pending. Pt is accepted to tele bed as inpt.   Ivor Costa, MD  [facility] Pager [phone]  If 7PM-7AM, please contact night-coverage [email or web address] Password [facility] 03/01/2015, 8:04 PM

## 2015-03-01 NOTE — ED Provider Notes (Signed)
CSN: [id number]     Arrival date & time 03/01/15  1620 History   First MD Initiated Contact with Patient 03/01/15 1631     Chief Complaint  Patient presents with  . Chest Pain     (Consider location/radiation/quality/duration/timing/severity/associated sxs/prior Treatment) Patient is a 45 y.o. female presenting with chest pain.  Chest Pain Chest pain location: left neck. Pain quality: tightness   Pain radiates to:  Neck, L jaw, L shoulder and L arm Pain radiates to the back: no   Pain severity:  Severe Onset quality:  Sudden Duration: 7/10. Timing:  Constant Progression:  Unchanged Associated symptoms: near-syncope   Associated symptoms: no abdominal pain, no back pain, no cough, no diaphoresis, no fever, no headache, no nausea, no shortness of breath and not vomiting   Risk factors: diabetes mellitus, high cholesterol and prior DVT/PE   Risk factors: no hypertension, no immobilization and no surgery   Risk factors comment:  +father with hx of CAD, died at 27of MI   Past Medical History  Diagnosis Date  . Diabetes mellitus   . History of blood clots    Past Surgical History  Procedure Laterality Date  . Vascular surgery     No family history on file. Social History  Substance Use Topics  . Smoking status: Current Some Day Smoker -- 0.00 packs/day  . Smokeless tobacco: None  . Alcohol Use: No   OB History    Gravida Para Term Preterm AB TAB SAB Ectopic Multiple Living   1 1 1       1      Review of Systems  Constitutional: Negative for fever and diaphoresis.  HENT: Negative for sore throat.   Eyes: Negative for visual disturbance.  Respiratory: Negative for cough and shortness of breath.   Cardiovascular: Positive for chest pain and near-syncope.  Gastrointestinal: Negative for nausea, vomiting, abdominal pain and diarrhea.  Genitourinary: Negative for difficulty urinating.  Musculoskeletal: Negative for back pain and neck pain.  Skin: Negative for rash.   Neurological: Positive for light-headedness. Negative for syncope and headaches.      Allergies  Review of patient's allergies indicates no known allergies.  Home Medications   Prior to Admission medications   Medication Sig Start Date End Date Taking? Authorizing Provider  aspirin EC 81 MG tablet Take 81 mg by mouth daily.    Historical Provider, MD  ciprofloxacin (CIPRO) 250 MG tablet Take 250 mg by mouth 2 (two) times daily.    Historical Provider, MD  meloxicam (MOBIC) 15 MG tablet Take 15 mg by mouth daily.    Historical Provider, MD  metFORMIN (GLUCOPHAGE) 1000 MG tablet Take 1,000 mg by mouth 2 (two) times daily with a meal.    Historical Provider, MD  oxyCODONE-acetaminophen (PERCOCET/ROXICET) 5-325 MG per tablet Take 1-2 tablets by mouth every 6 (six) hours as needed for severe pain. 04/18/14   Carlisle Cater, PA-C  oxyCODONE-acetaminophen (PERCOCET/ROXICET) 5-325 MG per tablet Take 2 tablets by mouth every 4 (four) hours as needed for severe pain. 05/06/14   West Pugh, NP  pravastatin (PRAVACHOL) 40 MG tablet Take 40 mg by mouth at bedtime.    Historical Provider, MD  ranitidine (ZANTAC) 150 MG capsule Take 150 mg by mouth 2 (two) times daily as needed for heartburn.    Historical Provider, MD  warfarin (COUMADIN) 7.5 MG tablet Take 7.5-15 mg by mouth daily. Tues/Thurs/Sat/Sunday - 7.5 mg Mon/Fri -11.25 mg Wed - 15 mg    Historical Provider, MD  BP 123/75 mmHg  Pulse 67  Temp(Src) 100 F (37.8 C) (Oral)  Resp 20  Ht 5' 5"  (1.651 m)  Wt 249 lb 14.4 oz (113.354 kg)  BMI 41.59 kg/m2  SpO2 99%  LMP 02/13/2015 Physical Exam  Constitutional: She is oriented to person, place, and time. She appears well-developed and well-nourished. No distress.  HENT:  Head: Normocephalic and atraumatic.  Eyes: Conjunctivae and EOM are normal.  Neck: Normal range of motion.  Cardiovascular: Normal rate, regular rhythm, normal heart sounds and intact distal pulses.  Exam reveals no  gallop and no friction rub.   No murmur heard. Pulmonary/Chest: Effort normal and breath sounds normal. No respiratory distress. She has no wheezes. She has no rales.  Abdominal: Soft. She exhibits no distension. There is no tenderness. There is no guarding.  Musculoskeletal: She exhibits no edema or tenderness.  Neurological: She is alert and oriented to person, place, and time.  Skin: Skin is warm and dry. No rash noted. She is not diaphoretic. No erythema.  Nursing note and vitals reviewed.   ED Course  Procedures (including critical care time) Labs Review Labs Reviewed  COMPREHENSIVE METABOLIC PANEL - Abnormal; Notable for the following:    Glucose, Bld 258 (*)    Calcium 8.6 (*)    All other components within normal limits  URINALYSIS, ROUTINE W REFLEX MICROSCOPIC (NOT AT [facility]) - Abnormal; Notable for the following:    APPearance CLOUDY (*)    Glucose, UA 500 (*)    Hgb urine dipstick SMALL (*)    Ketones, ur 15 (*)    All other components within normal limits  URINE MICROSCOPIC-ADD ON - Abnormal; Notable for the following:    Squamous Epithelial / LPF 0-5 (*)    Bacteria, UA FEW (*)    All other components within normal limits  TROPONIN I  CBC WITH DIFFERENTIAL/PLATELET  D-DIMER, QUANTITATIVE (NOT AT [facility])  PREGNANCY, URINE  PROTIME-INR  TROPONIN I  TROPONIN I  TROPONIN I  PROTIME-INR  PROTIME-INR    Imaging Review Dg Chest 2 View  03/01/2015  CLINICAL DATA:  Chest pain, shortness of Breath EXAM: CHEST  2 VIEW COMPARISON:  10/03/2009 and 11/30/2010 FINDINGS: Cardiomediastinal silhouette is unremarkable. No acute infiltrate or pleural effusion. No pulmonary edema. Mild degenerative changes lower thoracic spine. IMPRESSION: No active cardiopulmonary disease. Electronically Signed   By: Lahoma Crocker M.D.   On: 03/01/2015 17:45   I have personally reviewed and evaluated these images and lab results as part of my medical decision-making.   EKG  Interpretation   Date/Time:  Thursday March 01 2015 16:34:24 EST Ventricular Rate:  69 PR Interval:  154 QRS Duration: 68 QT Interval:  366 QTC Calculation: 392 R Axis:   27 Text Interpretation:  Normal sinus rhythm with sinus arrhythmia Normal ECG  No significant change since last tracing Reconfirmed by [facility] MD,  Deklyn Trachtenberg ([id number]) on 03/01/2015 6:58:47 PM      MDM   Final diagnoses:  Chest pain, unspecified chest pain type   45 year old female with history of DM, hypercholesterolemia, family history of CAD, PE on coumadin presents with concern of chest pain. Differential diagnosis for chest pain includes pulmonary embolus, dissection, pneumothorax, pneumonia, ACS, myocarditis, pericarditis.  EKG was done and evaluate by me and showed no acute ST changes and no signs of pericarditis. Chest x-ray was done and evaluated by me and radiology and showed no sign of pneumonia or pneumothorax.  Patient with history of PE, however is low  risk Wells and DDimer negative and doubt PE.  History and exam not consistent with dissection.  Patient's chest pressure improved with one nitroglycerin in the emergency department. Her initial troponin is negative, however she is high risk HEAR score.  CP again returned in ED and was resolved by nitroglycerin.  Attempted to call [facility] regarding admission, however there are no telemetry beds available. Discussed with Dr. Blaine Hamper, hospitalist and will transfer to [facility] for further evaluation of chest pain.   Gareth Morgan, MD 03/02/15 0110

## 2015-03-01 NOTE — H&P (Signed)
[facility]                                                                                    Patient Demographics  Cassidy Jarvis, is a 45 y.o. female  CSN: [id number]  MRN: [id number]  DOB - 1970/11/17  Admit Date - 03/01/2015  Outpatient Primary MD for the patient is Lilian Coma., MD   With History of -  Past Medical History  Diagnosis Date  . Diabetes mellitus   . History of blood clots       Past Surgical History  Procedure Laterality Date  . Vascular surgery      in for   Chief Complaint  Patient presents with  . Chest Pain     HPI  Cassidy Jarvis  is a 45 y.o. 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 The chest pain was squeezing no osseous and shortness of breath or nausea. It was elected 9 or 10 when it started and it was relieved by nitroglycerin sublingual. The patient reports that she was under a lot of stress lately. 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 No history of stress test or echo lately. Patient denies any history of heart disease but has family history of heart disease with father dying in his 88s from a massive heart attack    Review of Systems    In addition to the HPI above,  No Fever-chills, No Headache, No changes with Vision or hearing, No problems swallowing food or Liquids, No Abdominal pain, No Nausea or Vommitting, Bowel movements are regular, No Blood in stool or Urine, No dysuria, No new skin rashes or bruises, No new joints pains-aches,  No new weakness, tingling, numbness in any extremity, No recent weight gain or loss, No polyuria, polydypsia or polyphagia, No significant Mental  Stressors.  A full 10 point Review of Systems was done, except as stated above, all other Review of Systems were negative.   Social History Social History  Substance Use Topics  . Smoking status: Current Some Day Smoker -- 0.00 packs/day  . Smokeless tobacco: Not on file  . Alcohol Use: No     Family History Significant history of heart disease is reported  Prior to Admission medications   Medication Sig Start Date End Date Taking? Authorizing Provider  aspirin EC 81 MG tablet Take 81 mg by mouth daily.    Historical Provider, MD  ciprofloxacin (CIPRO) 250 MG tablet Take 250 mg by mouth 2 (two) times daily.    Historical Provider, MD  meloxicam (MOBIC) 15 MG tablet Take 15 mg by mouth daily.    Historical Provider, MD  metFORMIN (GLUCOPHAGE) 1000 MG tablet Take 1,000 mg by mouth 2 (two) times daily with a meal.    Historical Provider, MD  oxyCODONE-acetaminophen (PERCOCET/ROXICET) 5-325 MG per tablet Take 1-2 tablets by mouth every 6 (six)  hours as needed for severe pain. 04/18/14   Carlisle Cater, PA-C  oxyCODONE-acetaminophen (PERCOCET/ROXICET) 5-325 MG per tablet Take 2 tablets by mouth every 4 (four) hours as needed for severe pain. 05/06/14   West Pugh, NP  pravastatin (PRAVACHOL) 40 MG tablet Take 40 mg by mouth at bedtime.    Historical Provider, MD  ranitidine (ZANTAC) 150 MG capsule Take 150 mg by mouth 2 (two) times daily as needed for heartburn.    Historical Provider, MD  warfarin (COUMADIN) 7.5 MG tablet Take 7.5-15 mg by mouth daily. Tues/Thurs/Sat/Sunday - 7.5 mg Mon/Fri -11.25 mg Wed - 15 mg    Historical Provider, MD    No Known Allergies  Physical Exam  Vitals  Blood pressure 123/75, pulse 67, temperature 100 F (37.8 C), temperature source Oral, resp. rate 20, height 5' 5"  (1.651 m), weight 113.354 kg (249 lb 14.4 oz), last menstrual period 02/13/2015, SpO2 99 %.   1. General well-developed, well-nourished female, extremely pleasant  2. Normal affect  and insight, Not Suicidal or Homicidal, Awake Alert, Oriented X 3.  3. No F.N deficits,grossly patient moving all extremities.  4. Ears and Eyes appear Normal, Conjunctivae clear, PERRLA. Moist Oral Mucosa.  5. Supple Neck, No JVD, No cervical lymphadenopathy appriciated, No Carotid Bruits.  6. Symmetrical Chest wall movement, Good air movement bilaterally, CTAB.  7. RRR, No Gallops, Rubs or Murmurs, No Parasternal Heave.  8. Positive Bowel Sounds, Abdomen Soft, Non tender, No organomegaly appreciated,No rebound -guarding or rigidity.  9.  No Cyanosis, Normal Skin Turgor, No Skin Rash or Bruise.  10. Good muscle tone,  joints appear normal , no effusions, Normal ROM.    Data Review  CBC  Recent Labs Lab 03/01/15 1655  WBC 8.0  HGB 13.8  HCT 41.2  PLT 299  MCV 91.2  MCH 30.5  MCHC 33.5  RDW 13.4  LYMPHSABS 3.2  MONOABS 0.4  EOSABS 0.1  BASOSABS 0.0   ------------------------------------------------------------------------------------------------------------------  Chemistries   Recent Labs Lab 03/01/15 1655  NA 135  K 4.1  CL 104  CO2 24  GLUCOSE 258*  BUN 11  CREATININE 0.73  CALCIUM 8.6*  AST 18  ALT 17  ALKPHOS 67  BILITOT 0.6   ------------------------------------------------------------------------------------------------------------------ estimated creatinine clearance is 113.9 mL/min (by C-G formula based on Cr of 0.73). ------------------------------------------------------------------------------------------------------------------ No results for input(s): TSH, T4TOTAL, T3FREE, THYROIDAB in the last 72 hours.  Invalid input(s): FREET3   Coagulation profile No results for input(s): INR, PROTIME in the last 168 hours. -------------------------------------------------------------------------------------------------------------------  Recent Labs  03/01/15 1720  DDIMER <0.27    -------------------------------------------------------------------------------------------------------------------  Cardiac Enzymes  Recent Labs Lab 03/01/15 1655  TROPONINI <0.03   ------------------------------------------------------------------------------------------------------------------ Invalid input(s): POCBNP   ---------------------------------------------------------------------------------------------------------------  Urinalysis    Component Value Date/Time   COLORURINE YELLOW 03/01/2015 1915   APPEARANCEUR CLOUDY* 03/01/2015 1915   LABSPEC 1.027 03/01/2015 1915   PHURINE 7.5 03/01/2015 1915   GLUCOSEU 500* 03/01/2015 1915   HGBUR SMALL* 03/01/2015 1915   BILIRUBINUR NEGATIVE 03/01/2015 1915   KETONESUR 15* 03/01/2015 1915   PROTEINUR NEGATIVE 03/01/2015 1915   UROBILINOGEN 0.2 05/06/2014 1540   NITRITE NEGATIVE 03/01/2015 1915   LEUKOCYTESUR NEGATIVE 03/01/2015 1915    ----------------------------------------------------------------------------------------------------------------   Imaging results:   Dg Chest 2 View  03/01/2015  CLINICAL DATA:  Chest pain, shortness of Breath EXAM: CHEST  2 VIEW COMPARISON:  10/03/2009 and 11/30/2010 FINDINGS: Cardiomediastinal silhouette is unremarkable. No acute infiltrate or pleural effusion. No pulmonary edema. Mild degenerative changes lower  thoracic spine. IMPRESSION: No active cardiopulmonary disease. Electronically Signed   By: Lahoma Crocker M.D.   On: 03/01/2015 17:45    My personal review of EKG: sinus arrhythmia at 69 beats per minute with no acute changes    Assessment & Plan  1. Chest pain     Serial troponins      Check echo       Consider stress test  2. History of DVTs/multiple     Continue with Coumadin  3. History of diabetes mellitus     Insulin sliding scale   DVT Prophylaxis Coumadin  AM Labs Ordered, also please review Full Orders  Code Status Full  Disposition Plan: home  Time  spent in minutes : 32 minutes  Condition GUARDED   @SIGNATURE @

## 2015-03-01 NOTE — ED Notes (Signed)
Attempted to call report

## 2015-03-02 ENCOUNTER — Ambulatory Visit (HOSPITAL_COMMUNITY): Payer: Medicaid Other

## 2015-03-02 DIAGNOSIS — R079 Chest pain, unspecified: Secondary | ICD-10-CM

## 2015-03-02 LAB — TROPONIN I

## 2015-03-02 LAB — GLUCOSE, CAPILLARY
GLUCOSE-CAPILLARY: 215 mg/dL — AB (ref 65–99)
GLUCOSE-CAPILLARY: 195 mg/dL — AB (ref 65–99)
Glucose-Capillary: 186 mg/dL — ABNORMAL HIGH (ref 65–99)
Glucose-Capillary: 212 mg/dL — ABNORMAL HIGH (ref 65–99)

## 2015-03-02 LAB — PROTIME-INR
INR: 2.09 — ABNORMAL HIGH (ref 0.00–1.49)
Prothrombin Time: 23.3 seconds — ABNORMAL HIGH (ref 11.6–15.2)

## 2015-03-02 MED ORDER — ONDANSETRON HCL 4 MG PO TABS: 4.0000 mg | ORAL_TABLET | Freq: Four times a day (QID) | ORAL | Status: DC | PRN

## 2015-03-02 MED ORDER — PRAVASTATIN SODIUM 40 MG PO TABS
40.0000 mg | ORAL_TABLET | Freq: Every day | ORAL | Status: DC
  Administered 2015-03-02: 40 mg via ORAL
  Filled 2015-03-02: qty 1

## 2015-03-02 MED ORDER — METFORMIN HCL 500 MG PO TABS
1000.0000 mg | ORAL_TABLET | Freq: Two times a day (BID) | ORAL | Status: DC
  Administered 2015-03-02: 1000 mg via ORAL
  Filled 2015-03-02: qty 2

## 2015-03-02 MED ORDER — ACETAMINOPHEN 325 MG PO TABS: 650.0000 mg | ORAL_TABLET | Freq: Four times a day (QID) | ORAL | Status: DC | PRN

## 2015-03-02 MED ORDER — SODIUM CHLORIDE 0.9 % IV SOLN: 250.0000 mL | INTRAVENOUS | Status: DC | PRN

## 2015-03-02 MED ORDER — WARFARIN SODIUM 7.5 MG PO TABS: 11.2500 mg | ORAL_TABLET | Freq: Once | ORAL | Status: DC

## 2015-03-02 MED ORDER — SODIUM CHLORIDE 0.9% FLUSH: 3.0000 mL | Freq: Two times a day (BID) | INTRAVENOUS | Status: DC

## 2015-03-02 MED ORDER — HYDROCODONE-ACETAMINOPHEN 5-325 MG PO TABS: 1.0000 | ORAL_TABLET | ORAL | Status: DC | PRN

## 2015-03-02 MED ORDER — OXYCODONE-ACETAMINOPHEN 5-325 MG PO TABS: 2.0000 | ORAL_TABLET | ORAL | Status: DC | PRN

## 2015-03-02 MED ORDER — SODIUM CHLORIDE 0.9% FLUSH
3.0000 mL | Freq: Two times a day (BID) | INTRAVENOUS | Status: DC
  Administered 2015-03-02 (×2): 3 mL via INTRAVENOUS

## 2015-03-02 MED ORDER — WARFARIN - PHARMACIST DOSING INPATIENT: Freq: Every day | Status: DC

## 2015-03-02 MED ORDER — ONDANSETRON HCL 4 MG/2ML IJ SOLN: 4.0000 mg | Freq: Four times a day (QID) | INTRAMUSCULAR | Status: DC | PRN

## 2015-03-02 MED ORDER — SODIUM CHLORIDE 0.9% FLUSH: 3.0000 mL | INTRAVENOUS | Status: DC | PRN

## 2015-03-02 MED ORDER — OXYCODONE-ACETAMINOPHEN 5-325 MG PO TABS
1.0000 | ORAL_TABLET | Freq: Four times a day (QID) | ORAL | Status: DC | PRN
Start: 2015-03-02 — End: 2015-03-02

## 2015-03-02 MED ORDER — ZOLPIDEM TARTRATE 5 MG PO TABS
5.0000 mg | ORAL_TABLET | Freq: Every evening | ORAL | Status: DC | PRN
  Administered 2015-03-02: 5 mg via ORAL
  Filled 2015-03-02: qty 1

## 2015-03-02 MED ORDER — ASPIRIN EC 81 MG PO TBEC
81.0000 mg | DELAYED_RELEASE_TABLET | Freq: Every day | ORAL | Status: DC
  Administered 2015-03-02: 81 mg via ORAL
  Filled 2015-03-02: qty 1

## 2015-03-02 MED ORDER — ACETAMINOPHEN 650 MG RE SUPP: 650.0000 mg | Freq: Four times a day (QID) | RECTAL | Status: DC | PRN

## 2015-03-02 MED ORDER — MORPHINE SULFATE (PF) 2 MG/ML IV SOLN: 1.0000 mg | INTRAVENOUS | Status: DC | PRN

## 2015-03-02 MED ORDER — FAMOTIDINE 20 MG PO TABS
20.0000 mg | ORAL_TABLET | Freq: Every day | ORAL | Status: DC
  Administered 2015-03-02: 20 mg via ORAL
  Filled 2015-03-02: qty 1

## 2015-03-02 NOTE — Progress Notes (Signed)
  Echocardiogram 2D Echocardiogram has been performed.  Donata Clay 03/02/2015, 11:46 AM

## 2015-03-02 NOTE — Discharge Instructions (Signed)

## 2015-03-02 NOTE — Progress Notes (Signed)
Discharge instructions reviewed with the patient io include medications and follow up appointments.  Patient voices understanding to teaching. Ambulatory to the door.  Home via POV

## 2015-03-02 NOTE — Progress Notes (Signed)
Patient transferred from [facility] via ambulance/EMT. Oriented patient to room and room equipment. Notified CMT of new admit and to verify telemetry box and telemetry leads. Patient complained of no pain or discomfort on admission to floor or during the night. Will continue to monitor patient to end of shift.

## 2015-03-02 NOTE — Progress Notes (Addendum)
ANTICOAGULATION CONSULT NOTE - Initial Consult  Pharmacy Consult for Coumadin Indication: DVT history  No Known Allergies  Patient Measurements: Height: 5' 5"  (165.1 cm) Weight: 249 lb 14.4 oz (113.354 kg) (scalel a) IBW/kg (Calculated) : 57   Vital Signs: Temp: 100 F (37.8 C) (01/26 2250) Temp Source: Oral (01/26 2250) BP: 123/75 mmHg (01/26 2250) Pulse Rate: 67 (01/26 2250)  Labs:  Recent Labs  03/01/15 1655  HGB 13.8  HCT 41.2  PLT 299  CREATININE 0.73  TROPONINI <0.03    Estimated Creatinine Clearance: 113.9 mL/min (by C-G formula based on Cr of 0.73).   Medical History: Past Medical History  Diagnosis Date  . Diabetes mellitus   . History of blood clots     Assessment: 45 year old female admitted with chest pain on Coumadin PTA for history of multiple DVTs.  Admission INR pending  Goal of Therapy:  INR 2-3 Monitor platelets by anticoagulation protocol: Yes   Plan:  Await AM INR  Thank you Anette Guarneri, PharmD [phone]  03/02/2015,2:06 AM  ===========================   Addendum: - INR therapeutic at 2.09 - no bleeding   Plan: - Coumadin 11.20m PO today - Daily PT / INR for now   Marquell Saenz D. DMina Marble PharmD, BCPS Pager:  [phone] 30771/27/2017, 12:55 PM

## 2015-03-02 NOTE — Progress Notes (Signed)
Inpatient Diabetes Program Recommendations  AACE/ADA: New Consensus Statement on Inpatient Glycemic Control (2015)  Target Ranges:  Prepandial:   less than 140 mg/dL      Peak postprandial:   less than 180 mg/dL (1-2 hours)      Critically ill patients:  140 - 180 mg/dL   Results for Cassidy Jarvis, Cassidy Jarvis (MRN [id number]) as of 03/02/2015 10:32  Ref. Range 03/02/2015 01:28 03/02/2015 08:35  Glucose-Capillary Latest Ref Range: 65-99 mg/dL 215 (H) 186 (H)    Admit with: CP  History: DM  Home DM Meds: Metformin 1000 mg bid  Current Insulin Orders: Metformin 1000 mg bid     MD- May want to stop Metformin for now and Start Novolog Moderate SSI (0-15 units) TID AC + HS     --Will follow patient during hospitalization--  Wyn Quaker RN, MSN, CDE Diabetes Coordinator Inpatient Glycemic Control Team Team Pager: [phone] (8a-5p)

## 2015-03-02 NOTE — Discharge Summary (Addendum)
Physician Discharge Summary  Cassidy Jarvis ENI:[id number] DOB: 05-04-1970 DOA: 03/01/2015  PCP: Lilian Coma., MD  Admit date: 03/01/2015 Discharge date: 03/02/2015  Time spent: 35 minutes  Recommendations for Outpatient Follow-up:  Monitor diabetic control Stress relief  Discharge Diagnoses:  Active Problems:   Chest pain   Pain in the chest   Primary hypercoagulable state [facility])   Discharge Condition: improved  Diet recommendation: cardiac/diabetic diet  Filed Weights   03/01/15 1629 03/01/15 2250 03/02/15 0331  Weight: 116.121 kg (256 lb) 113.354 kg (249 lb 14.4 oz) 113.989 kg (251 lb 4.8 oz)    History of present illness:  Cassidy Jarvis is a 45 y.o. 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 The chest pain was squeezing no osseous and shortness of breath or nausea. It was elected 9 or 10 when it started and it was relieved by nitroglycerin sublingual. The patient reports that she was under a lot of stress lately. 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 No history of stress test or echo lately. Patient denies any history of heart disease but has family history of heart disease with father dying in his 90s from a massive heart attack  Hospital Course:  Chest pain- resolved, has been under a lot of stress  Serial troponins negative  echo normal  Consider stress test- outpatient if it continues  History of DVTs/multiple  Continue with Coumadin  History of diabetes mellitus  diabetic diet  Procedures:    Consultations:    Discharge Exam: Filed Vitals:   03/01/15 2250 03/02/15 1319  BP: 123/75 112/78  Pulse: 67 81  Temp: 100 F  (37.8 C) 98.2 F (36.8 C)  Resp: 20 18    General: awake, NAD Cardiovascular: rrr Respiratory: clear  Discharge Instructions    Current Discharge Medication List    CONTINUE these medications which have NOT CHANGED   Details  aspirin EC 81 MG tablet Take 81 mg by mouth daily.    gabapentin (NEURONTIN) 100 MG capsule Take 200 mg by mouth at bedtime as needed (pain).  Refills: 1    omeprazole (PRILOSEC) 40 MG capsule Take 40 mg by mouth daily as needed.    pravastatin (PRAVACHOL) 40 MG tablet Take 40 mg by mouth at bedtime.    warfarin (COUMADIN) 7.5 MG tablet Take 7.5-11.25 mg by mouth every evening. Take 1 1/2 tablets (11.25 mg) by mouth on Monday, Wednesday, Friday, take 1 tablet (7.5 mg) on Sunday, Tuesday, Thursday, Saturday.    metFORMIN (GLUCOPHAGE-XR) 500 MG 24 hr tablet Take 2,000 mg by mouth daily with breakfast.    !! oxyCODONE-acetaminophen (PERCOCET/ROXICET) 5-325 MG per tablet Take 1-2 tablets by mouth every 6 (six) hours as needed for severe pain. Qty: 12 tablet, Refills: 0    !! oxyCODONE-acetaminophen (PERCOCET/ROXICET) 5-325 MG per tablet Take 2 tablets by mouth every 4 (four) hours as needed for severe pain. Qty: 30 tablet, Refills: 0     !! - Potential duplicate medications found. Please discuss with provider.    STOP taking these medications     metFORMIN (GLUCOPHAGE) 1000 MG tablet      ranitidine (ZANTAC) 150 MG capsule        No  Known Allergies Follow-up Information    Follow up with JOBE,DANIEL B., MD In 1 week.   Specialty:  Internal Medicine   Why:  with INR check   Contact information:   [place] [address] [place] [address] [place] [address] [phone]        The results of significant diagnostics from this hospitalization (including imaging, microbiology, ancillary and laboratory) are listed below for reference.    Significant Diagnostic Studies: Dg Chest 2 View  03/01/2015  CLINICAL DATA:  Chest pain, shortness of Breath EXAM:  CHEST  2 VIEW COMPARISON:  10/03/2009 and 11/30/2010 FINDINGS: Cardiomediastinal silhouette is unremarkable. No acute infiltrate or pleural effusion. No pulmonary edema. Mild degenerative changes lower thoracic spine. IMPRESSION: No active cardiopulmonary disease. Electronically Signed   By: Lahoma Crocker M.D.   On: 03/01/2015 17:45    Microbiology: No results found for this or any previous visit (from the past 240 hour(s)).   Labs: Basic Metabolic Panel:  Recent Labs Lab 03/01/15 1655  NA 135  K 4.1  CL 104  CO2 24  GLUCOSE 258*  BUN 11  CREATININE 0.73  CALCIUM 8.6*   Liver Function Tests:  Recent Labs Lab 03/01/15 1655  AST 18  ALT 17  ALKPHOS 67  BILITOT 0.6  PROT 6.5  ALBUMIN 3.5   No results for input(s): LIPASE, AMYLASE in the last 168 hours. No results for input(s): AMMONIA in the last 168 hours. CBC:  Recent Labs Lab 03/01/15 1655  WBC 8.0  NEUTROABS 4.4  HGB 13.8  HCT 41.2  MCV 91.2  PLT 299   Cardiac Enzymes:  Recent Labs Lab 03/01/15 1655 03/02/15 0418 03/02/15 0728  TROPONINI <0.03 <0.03 <0.03   BNP: BNP (last 3 results) No results for input(s): BNP in the last 8760 hours.  ProBNP (last 3 results) No results for input(s): PROBNP in the last 8760 hours.  CBG:  Recent Labs Lab 03/02/15 0128 03/02/15 0835 03/02/15 1117  GLUCAP 215* 186* 212*       Signed:  JESSICA U VANN DO.  [facility] 03/02/2015, 2:12 PM

## 2015-03-02 NOTE — Care Management Note (Signed)
Case Management Note  Patient Details  Name: DRUE CAMERA MRN: [id number] Date of Birth: [birth year]  Subjective/Objective:                    Action/Plan: Patient presented with chest pain.  Will follow for discharge needs pending PT/OT evals and physician orders.  Expected Discharge Date:                  Expected Discharge Plan:     In-House Referral:     Discharge planning Services     Post Acute Care Choice:    Choice offered to:     DME Arranged:    DME Agency:     HH Arranged:    HH Agency:     Status of Service:  In process, will continue to follow  Medicare Important Message Given:    Date Medicare IM Given:    Medicare IM give by:    Date Additional Medicare IM Given:    Additional Medicare Important Message give by:     If discussed at [place] of Stay Meetings, dates discussed:    Additional CommentsRolm Baptise, RN 03/02/2015, 4:08 PM [phone]

## 2015-04-12 ENCOUNTER — Other Ambulatory Visit: Payer: Self-pay | Admitting: Obstetrics and Gynecology

## 2015-04-16 LAB — CYTOLOGY - PAP

## 2015-04-24 ENCOUNTER — Encounter (HOSPITAL_BASED_OUTPATIENT_CLINIC_OR_DEPARTMENT_OTHER): Payer: Self-pay | Admitting: Emergency Medicine

## 2015-04-24 ENCOUNTER — Emergency Department (HOSPITAL_BASED_OUTPATIENT_CLINIC_OR_DEPARTMENT_OTHER): Payer: Medicaid Other

## 2015-04-24 ENCOUNTER — Emergency Department (HOSPITAL_BASED_OUTPATIENT_CLINIC_OR_DEPARTMENT_OTHER)
Admission: EM | Admit: 2015-04-24 | Discharge: 2015-04-24 | Disposition: A | Discharge status: Home / Self Care | Payer: Medicaid Other | Attending: Physician Assistant | Admitting: Physician Assistant

## 2015-04-24 DIAGNOSIS — E119 Type 2 diabetes mellitus without complications: Secondary | ICD-10-CM | POA: Diagnosis not present

## 2015-04-24 DIAGNOSIS — F17210 Nicotine dependence, cigarettes, uncomplicated: Secondary | ICD-10-CM | POA: Diagnosis not present

## 2015-04-24 DIAGNOSIS — Z3202 Encounter for pregnancy test, result negative: Secondary | ICD-10-CM | POA: Insufficient documentation

## 2015-04-24 DIAGNOSIS — B349 Viral infection, unspecified: Secondary | ICD-10-CM | POA: Insufficient documentation

## 2015-04-24 DIAGNOSIS — Z7901 Long term (current) use of anticoagulants: Secondary | ICD-10-CM | POA: Diagnosis not present

## 2015-04-24 DIAGNOSIS — Z7982 Long term (current) use of aspirin: Secondary | ICD-10-CM | POA: Diagnosis not present

## 2015-04-24 DIAGNOSIS — R05 Cough: Secondary | ICD-10-CM | POA: Diagnosis present

## 2015-04-24 LAB — URINALYSIS, ROUTINE W REFLEX MICROSCOPIC
BILIRUBIN URINE: NEGATIVE
Glucose, UA: NEGATIVE mg/dL
Ketones, ur: >80 mg/dL — AB
Leukocytes, UA: NEGATIVE
NITRITE: NEGATIVE
PH: 6 (ref 5.0–8.0)
Protein, ur: NEGATIVE mg/dL
SPECIFIC GRAVITY, URINE: 1.021 (ref 1.005–1.030)

## 2015-04-24 LAB — CBC WITH DIFFERENTIAL/PLATELET
BASOS ABS: 0 10*3/uL (ref 0.0–0.1)
BASOS PCT: 0 %
Eosinophils Absolute: 0 10*3/uL (ref 0.0–0.7)
Eosinophils Relative: 0 %
HEMATOCRIT: 37.5 % (ref 36.0–46.0)
HEMOGLOBIN: 12.7 g/dL (ref 12.0–15.0)
Lymphocytes Relative: 30 %
Lymphs Abs: 1.4 10*3/uL (ref 0.7–4.0)
MCH: 30.5 pg (ref 26.0–34.0)
MCHC: 33.9 g/dL (ref 30.0–36.0)
MCV: 89.9 fL (ref 78.0–100.0)
MONOS PCT: 10 %
Monocytes Absolute: 0.5 10*3/uL (ref 0.1–1.0)
NEUTROS ABS: 2.8 10*3/uL (ref 1.7–7.7)
NEUTROS PCT: 60 %
Platelets: 277 10*3/uL (ref 150–400)
RBC: 4.17 MIL/uL (ref 3.87–5.11)
RDW: 13.1 % (ref 11.5–15.5)
WBC: 4.6 10*3/uL (ref 4.0–10.5)

## 2015-04-24 LAB — COMPREHENSIVE METABOLIC PANEL
ALBUMIN: 3.4 g/dL — AB (ref 3.5–5.0)
ALK PHOS: 51 U/L (ref 38–126)
ALT: 19 U/L (ref 14–54)
AST: 25 U/L (ref 15–41)
Anion gap: 10 (ref 5–15)
BILIRUBIN TOTAL: 0.7 mg/dL (ref 0.3–1.2)
BUN: 10 mg/dL (ref 6–20)
CALCIUM: 8.1 mg/dL — AB (ref 8.9–10.3)
CO2: 19 mmol/L — ABNORMAL LOW (ref 22–32)
Chloride: 104 mmol/L (ref 101–111)
Creatinine, Ser: 0.68 mg/dL (ref 0.44–1.00)
GFR calc Af Amer: >60 mL/min (ref >60)
GFR calc non Af Amer: >60 mL/min (ref >60)
GLUCOSE: 103 mg/dL — AB (ref 65–99)
Potassium: 3 mmol/L — ABNORMAL LOW (ref 3.5–5.1)
Sodium: 133 mmol/L — ABNORMAL LOW (ref 135–145)
TOTAL PROTEIN: 6.6 g/dL (ref 6.5–8.1)

## 2015-04-24 LAB — URINE MICROSCOPIC-ADD ON

## 2015-04-24 LAB — LIPASE, BLOOD: Lipase: 25 U/L (ref 11–51)

## 2015-04-24 LAB — PREGNANCY, URINE: Preg Test, Ur: NEGATIVE

## 2015-04-24 MED ORDER — KETOROLAC TROMETHAMINE 30 MG/ML IJ SOLN: 30.0000 mg | Freq: Once | INTRAMUSCULAR | Status: DC

## 2015-04-24 MED ORDER — ACETAMINOPHEN 325 MG PO TABS
650.0000 mg | ORAL_TABLET | Freq: Once | ORAL | Status: AC
  Administered 2015-04-24: 650 mg via ORAL
  Filled 2015-04-24: qty 2

## 2015-04-24 MED ORDER — SODIUM CHLORIDE 0.9 % IV BOLUS (SEPSIS)
1000.0000 mL | Freq: Once | INTRAVENOUS | Status: AC
  Administered 2015-04-24: 1000 mL via INTRAVENOUS

## 2015-04-24 MED ORDER — ACETAMINOPHEN 325 MG PO TABS: 650.0000 mg | ORAL_TABLET | Freq: Once | ORAL | Status: DC

## 2015-04-24 MED ORDER — POTASSIUM CHLORIDE CRYS ER 20 MEQ PO TBCR
40.0000 meq | EXTENDED_RELEASE_TABLET | Freq: Once | ORAL | Status: AC
  Administered 2015-04-24: 40 meq via ORAL
  Filled 2015-04-24: qty 2

## 2015-04-24 MED ORDER — IBUPROFEN 800 MG PO TABS
800.0000 mg | ORAL_TABLET | Freq: Once | ORAL | Status: AC
  Administered 2015-04-24: 800 mg via ORAL
  Filled 2015-04-24: qty 1

## 2015-04-24 MED ORDER — ONDANSETRON HCL 4 MG PO TABS: 4.0000 mg | ORAL_TABLET | Freq: Three times a day (TID) | ORAL | Status: DC | PRN

## 2015-04-24 MED ORDER — ONDANSETRON HCL 8 MG PO TABS
4.0000 mg | ORAL_TABLET | Freq: Once | ORAL | Status: AC
  Administered 2015-04-24: 4 mg via ORAL
  Filled 2015-04-24: qty 1

## 2015-04-24 NOTE — Discharge Instructions (Signed)
We are sorry you are feeling unwell. Please use Zofran to help with your nausea. Please rest at home and drink plenty of fluids. Please return with any concerns.   Viral Infections A viral infection can be caused by different types of viruses.Most viral infections are not serious and resolve on their own. However, some infections may cause severe symptoms and may lead to further complications. SYMPTOMS Viruses can frequently cause:  Minor sore throat.  Aches and pains.  Headaches.  Runny nose.  Different types of rashes.  Watery eyes.  Tiredness.  Cough.  Loss of appetite.  Gastrointestinal infections, resulting in nausea, vomiting, and diarrhea. These symptoms do not respond to antibiotics because the infection is not caused by bacteria. However, you might catch a bacterial infection following the viral infection. This is sometimes called a "superinfection." Symptoms of such a bacterial infection may include:  Worsening sore throat with pus and difficulty swallowing.  Swollen neck glands.  Chills and a high or persistent fever.  Severe headache.  Tenderness over the sinuses.  Persistent overall ill feeling (malaise), muscle aches, and tiredness (fatigue).  Persistent cough.  Yellow, green, or brown mucus production with coughing. HOME CARE INSTRUCTIONS   Only take over-the-counter or prescription medicines for pain, discomfort, diarrhea, or fever as directed by your caregiver.  Drink enough water and fluids to keep your urine clear or pale yellow. Sports drinks can provide valuable electrolytes, sugars, and hydration.  Get plenty of rest and maintain proper nutrition. Soups and broths with crackers or rice are fine. SEEK IMMEDIATE MEDICAL CARE IF:   You have severe headaches, shortness of breath, chest pain, neck pain, or an unusual rash.  You have uncontrolled vomiting, diarrhea, or you are unable to keep down fluids.  You or your child has an oral  temperature above 102 F (38.9 C), not controlled by medicine.  Your baby is older than 3 months with a rectal temperature of 102 F (38.9 C) or higher.  Your baby is 55 months old or younger with a rectal temperature of 100.4 F (38 C) or higher. MAKE SURE YOU:   Understand these instructions.  Will watch your condition.  Will get help right away if you are not doing well or get worse.   This information is not intended to replace advice given to you by your health care provider. Make sure you discuss any questions you have with your health care provider.   Document Released: 10/30/2004 Document Revised: 04/14/2011 Document Reviewed: 06/28/2014 Elsevier Interactive Patient Education [employer].

## 2015-04-24 NOTE — ED Notes (Signed)
Pt exposed to the flu on Friday.  Started having flu like symptoms on Saturday.  Pt states she is feeling bad and is concerned because she has "blood clot issues"

## 2015-04-24 NOTE — ED Provider Notes (Signed)
CSN: [id number]     Arrival date & time 04/24/15  1429 History   First MD Initiated Contact with Patient 04/24/15 1529     Chief Complaint  Patient presents with  . URI     (Consider location/radiation/quality/duration/timing/severity/associated sxs/prior Treatment) HPI   Patient is a 45 year old female presenting with viral syndrome. Patient's had mild nausea vomiting and diarrhea. She's had cough. These symptoms are consistent with the virus that has been common in our area. Patient reports that her friend had recently had a same illness the same symptoms and she drove her friends car and feels like she got exposed to it. Patient's been able to take adequate by mouth.  All symptoms are mild, she reports fatigue.  Patient has no urinary symptoms. Patient has type 2 diabetes.  Past Medical History  Diagnosis Date  . Diabetes mellitus   . History of blood clots    Past Surgical History  Procedure Laterality Date  . Vascular surgery     No family history on file. Social History  Substance Use Topics  . Smoking status: Current Some Day Smoker -- 0.00 packs/day  . Smokeless tobacco: None  . Alcohol Use: No   OB History    Gravida Para Term Preterm AB TAB SAB Ectopic Multiple Living   1 1 1       1      Review of Systems  Constitutional: Positive for fever and fatigue. Negative for activity change.  HENT: Negative for congestion.   Eyes: Negative for discharge.  Respiratory: Positive for cough. Negative for chest tightness.   Cardiovascular: Negative for chest pain.  Gastrointestinal: Positive for nausea. Negative for abdominal pain and abdominal distention.  Genitourinary: Negative for dysuria.  Musculoskeletal: Negative for joint swelling.  Skin: Negative for rash.  Allergic/Immunologic: Negative for immunocompromised state.  Psychiatric/Behavioral: Negative for behavioral problems.      Allergies  Review of patient's allergies indicates no known allergies.  Home  Medications   Prior to Admission medications   Medication Sig Start Date End Date Taking? Authorizing Provider  aspirin EC 81 MG tablet Take 81 mg by mouth daily.   Yes Historical Provider, MD  omeprazole (PRILOSEC) 40 MG capsule Take 40 mg by mouth daily as needed.   Yes Historical Provider, MD  pravastatin (PRAVACHOL) 40 MG tablet Take 40 mg by mouth at bedtime.   Yes Historical Provider, MD  warfarin (COUMADIN) 7.5 MG tablet Take 7.5-11.25 mg by mouth every evening. Take 1 1/2 tablets (11.25 mg) by mouth on Monday, Wednesday, Friday, take 1 tablet (7.5 mg) on Sunday, Tuesday, Thursday, Saturday.   Yes Historical Provider, MD  ondansetron (ZOFRAN) 4 MG tablet Take 1 tablet (4 mg total) by mouth every 8 (eight) hours as needed for nausea or vomiting. 04/24/15   Jacqualyn Sedgwick Lyn Kaiyon Hynes, MD   BP 105/63 mmHg  Pulse 67  Temp(Src) 99.8 F (37.7 C) (Oral)  Resp 18  SpO2 98%  LMP 04/13/2015 Physical Exam  Constitutional: She is oriented to person, place, and time. She appears well-developed and well-nourished.  HENT:  Head: Normocephalic and atraumatic.  No erythema to the posterior pharynx.  Eyes: Conjunctivae are normal. Right eye exhibits no discharge.  Neck: Neck supple.  Cardiovascular: Normal rate, regular rhythm and normal heart sounds.   No murmur heard. Pulmonary/Chest: Effort normal and breath sounds normal. She has no wheezes. She has no rales.  Abdominal: Soft. She exhibits no distension. There is no tenderness.  No tenderness.  Musculoskeletal: Normal range  of motion. She exhibits no edema.  Neurological: She is oriented to person, place, and time. No cranial nerve deficit.  Skin: Skin is warm and dry. No rash noted. She is not diaphoretic.  Psychiatric: She has a normal mood and affect. Her behavior is normal.  Nursing note and vitals reviewed.   ED Course  Procedures (including critical care time) Labs Review Labs Reviewed  COMPREHENSIVE METABOLIC PANEL - Abnormal;  Notable for the following:    Sodium 133 (*)    Potassium 3.0 (*)    CO2 19 (*)    Glucose, Bld 103 (*)    Calcium 8.1 (*)    Albumin 3.4 (*)    All other components within normal limits  URINALYSIS, ROUTINE W REFLEX MICROSCOPIC (NOT AT [facility]) - Abnormal; Notable for the following:    Hgb urine dipstick MODERATE (*)    Ketones, ur >80 (*)    All other components within normal limits  URINE MICROSCOPIC-ADD ON - Abnormal; Notable for the following:    Squamous Epithelial / LPF 0-5 (*)    Bacteria, UA RARE (*)    All other components within normal limits  CBC WITH DIFFERENTIAL/PLATELET  LIPASE, BLOOD  PREGNANCY, URINE    Imaging Review Dg Chest 2 View  04/24/2015  CLINICAL DATA:  Chest pain. Cough, fever, chills, fatigue, and near-syncope. Symptoms for 3 days. Smoker. EXAM: CHEST  2 VIEW COMPARISON:  03/01/2015 FINDINGS: The cardiomediastinal silhouette is within normal limits. The lungs are well inflated. There is mild central airway thickening. No confluent airspace opacity, edema, pleural effusion, or pneumothorax is identified. IMPRESSION: Mild bronchitic changes.  No evidence of lobar pneumonia. Electronically Signed   By: Logan Bores M.D.   On: 04/24/2015 16:29   I have personally reviewed and evaluated these images and lab results as part of my medical decision-making.   EKG Interpretation None      MDM   Final diagnoses:  Viral syndrome    The patient is a 44 year old female with past medical history of diabetes presenting today with several days of flulike symptoms. Patient had known exposure to flu. Patient here and mild nausea and occasional vomiting, cough. We'll do chest x-ray to rule out occult infection, we'll do baseline labs give fluids and symptomatic care. Anticipate ability to discharge home given normal vital signs and physical exam.    Sofya Moustafa Julio Alm, MD 04/26/15 0710

## 2015-12-17 IMAGING — US US PELVIS COMPLETE
1 series · 14 of 25 positions shown · non-contrast
Comparison: Pelvic MRI dated 05/02/2011

CLINICAL DATA: Abnormal pelvic bleeding. Mid pelvic pain. The
patient is on warfarin for DVT.



[Series 1: us pelvis complete · 14 of 49 slices shown]
[im 1/49]
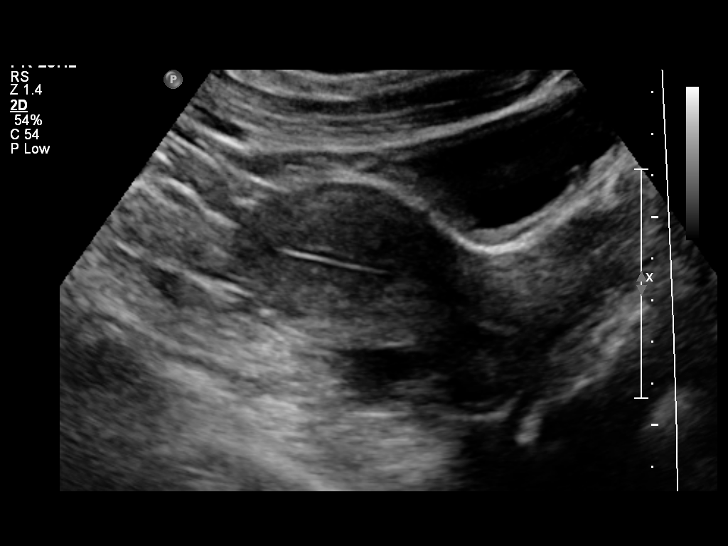
[im 5/49]
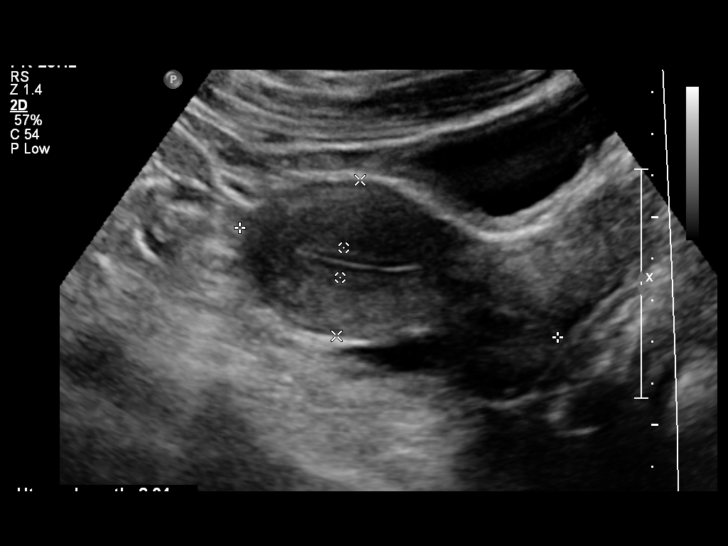
[im 9/49]
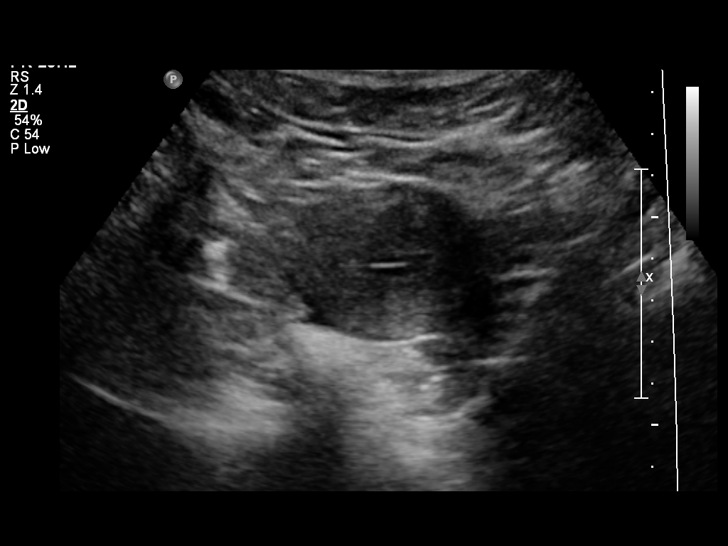
[im 13/49]
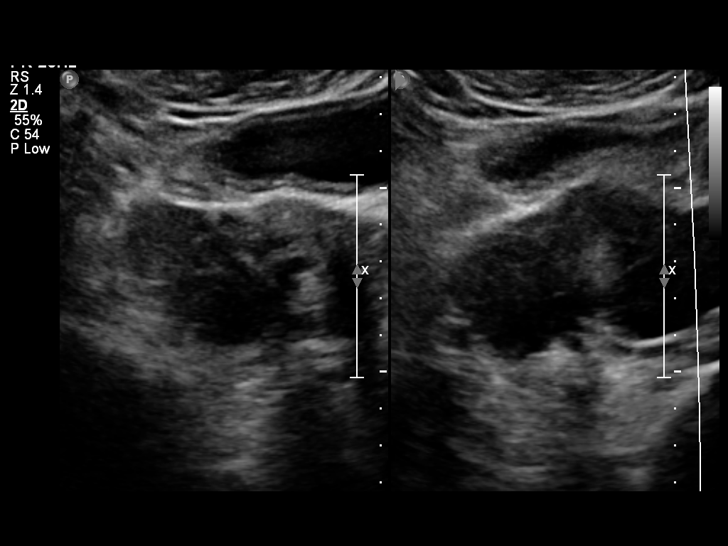
[im 17/49]
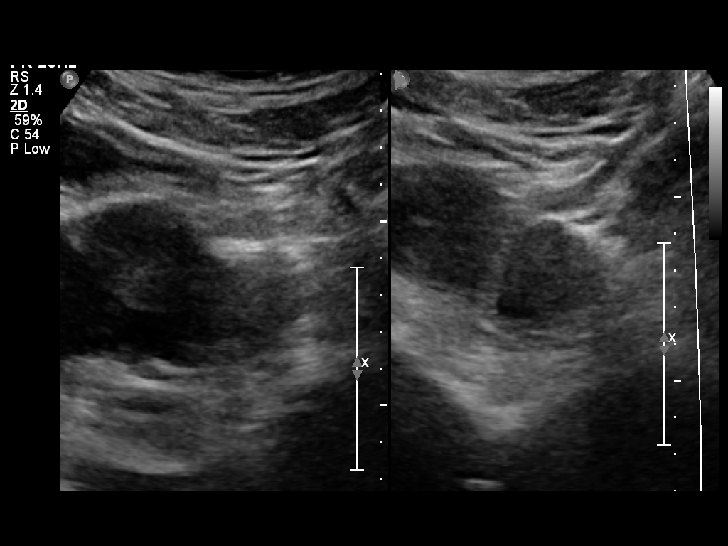
[im 19/49]
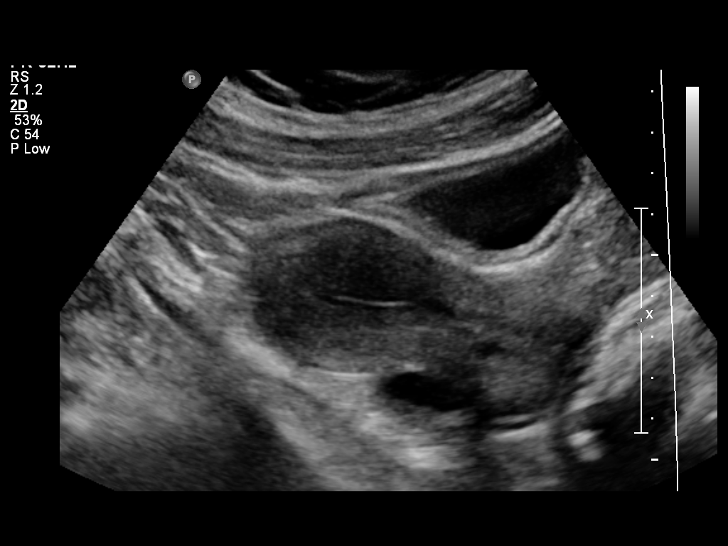
[im 23/49]
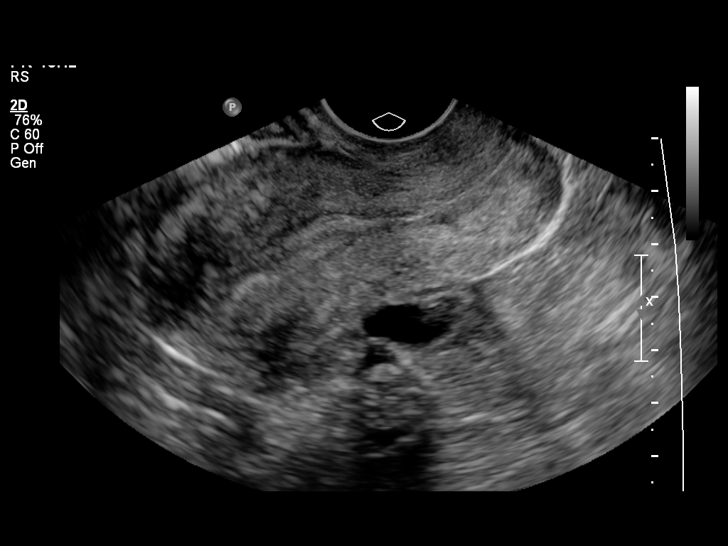
[im 27/49]
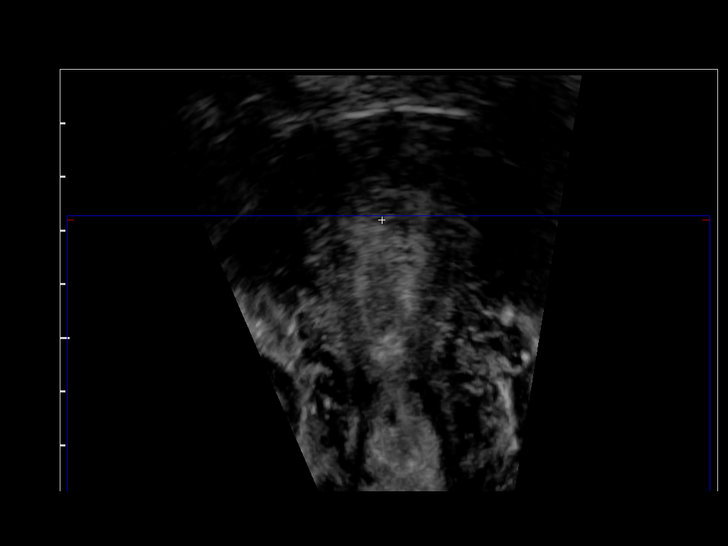
[im 31/49]
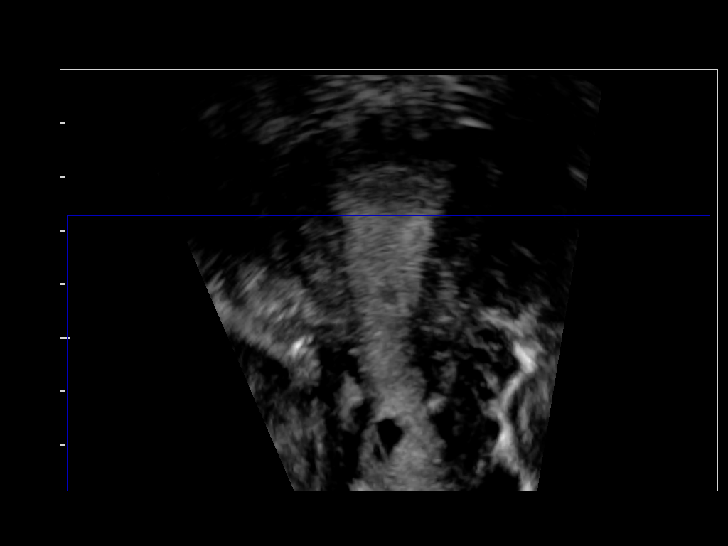
[im 33/49]
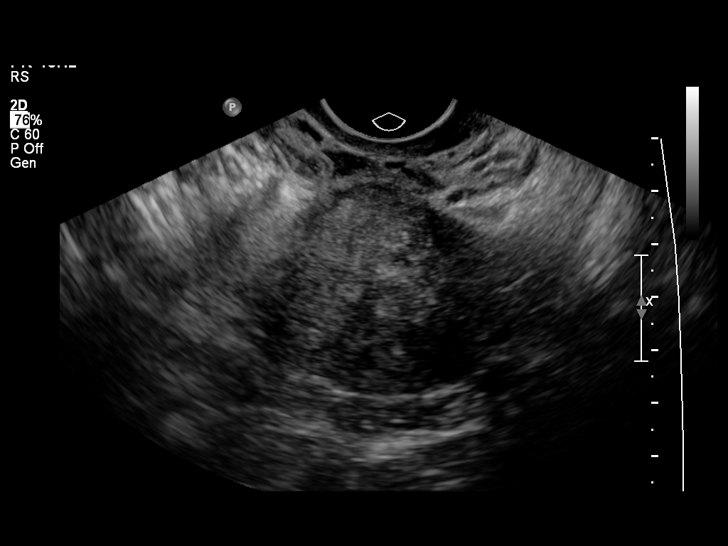
[im 37/49]
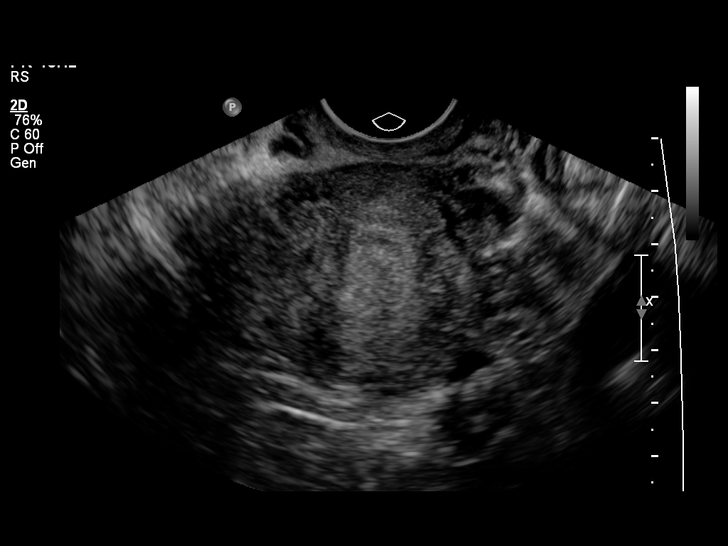
[im 41/49]
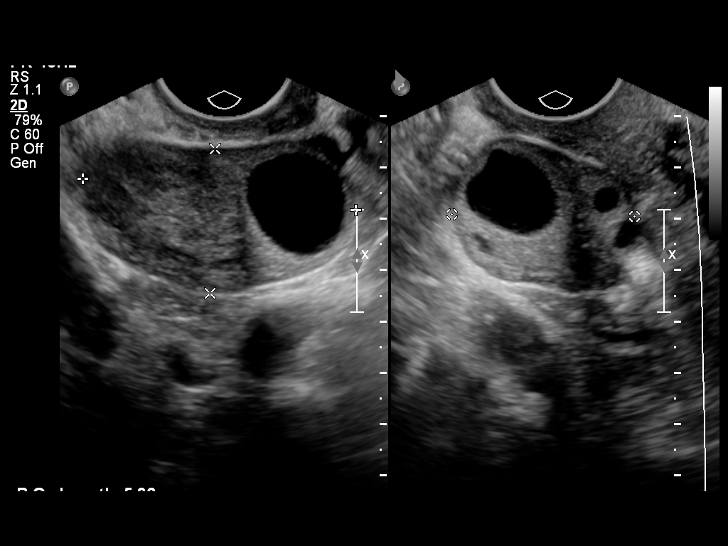
[im 45/49]
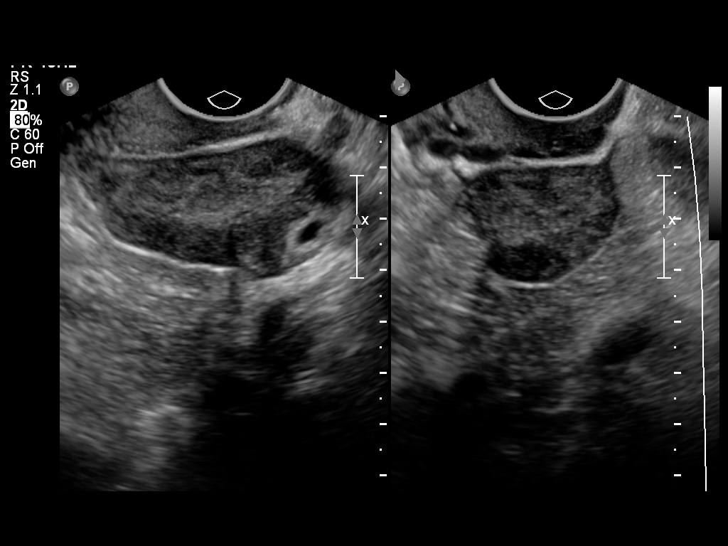
[im 49/49]
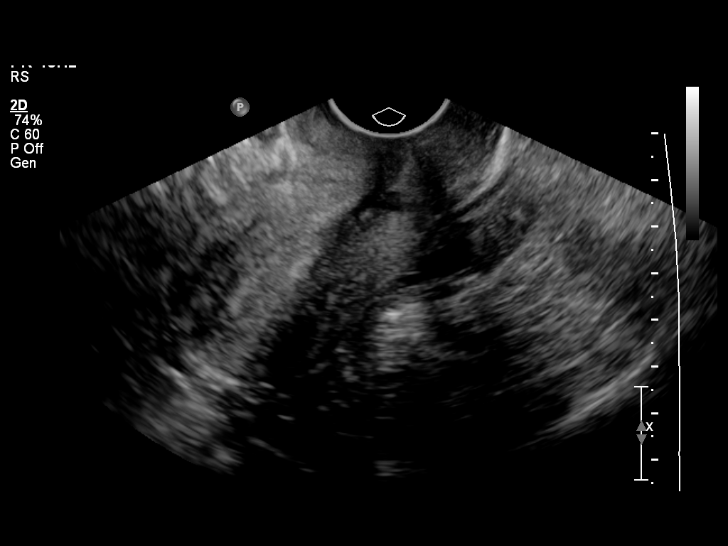

[14 of 25 positions shown; findings below may reference images not displayed]

FINDINGS: Uterus

Measurements: 7.9 by 4.1 by 5.1 cm. No fibroids or other mass
visualized.

Endometrium

Thickness: 8 mm.  No focal abnormality visualized.

Right ovary

Measurements: 5.4 by 2.8 by 3.6 cm (volume 28 mL).. 2.2 cm simple
appearing cyst.

Left ovary

Measurements: 5.2 by 2.7 by 3.0 cm (volume 22 mL). Normal
appearance/no adnexal mass.

Other findings

No free fluid.
IMPRESSION: 1. Both ovaries are mildly enlarged but this is similar to
05/02/2011. We do not demonstrate numerous peripheral follicles to
suggest polycystic ovary disease.
2. Small simple appearing right ovarian cyst.

## 2015-12-29 IMAGING — US US TRANSVAGINAL NON-OB
1 series · 13 of 25 positions shown · non-contrast
Comparison: Ultrasound 05/06/2014

CLINICAL DATA: Vaginal bleeding, followup, premenopausal female.
The reported clinical history states endometrial thickening.

EXAM:
ULTRASOUND PELVIS TRANSVAGINAL
TECHNIQUE: Transvaginal ultrasound examination of the pelvis was performed
including evaluation of the uterus, ovaries, adnexal regions, and
pelvic cul-de-sac.

[Series 1: us non-ob tv/pel · 32 acquisitions, 13 frames shown]
[im 1/32]
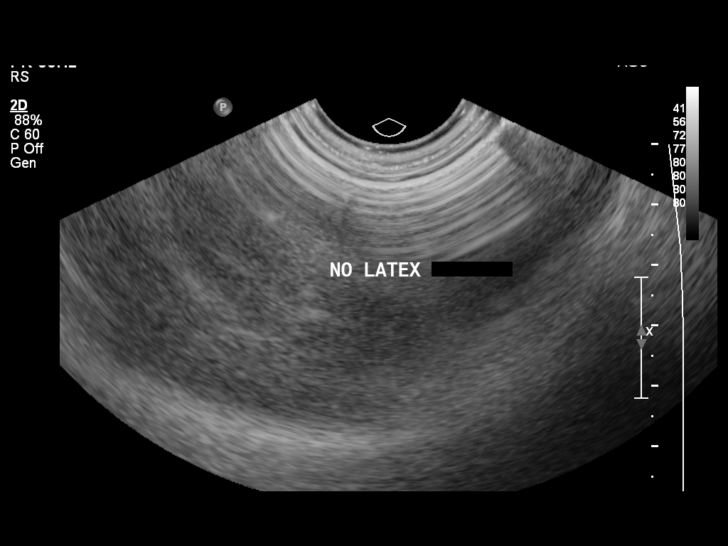
[im 3/32]
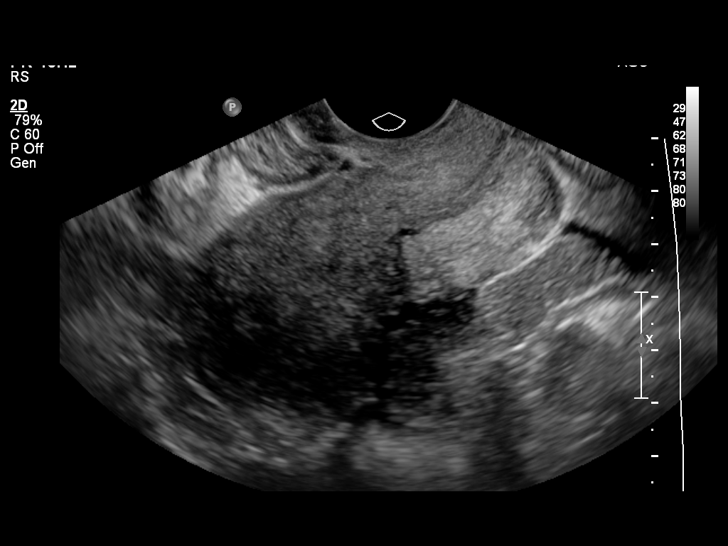
[im 6/32]
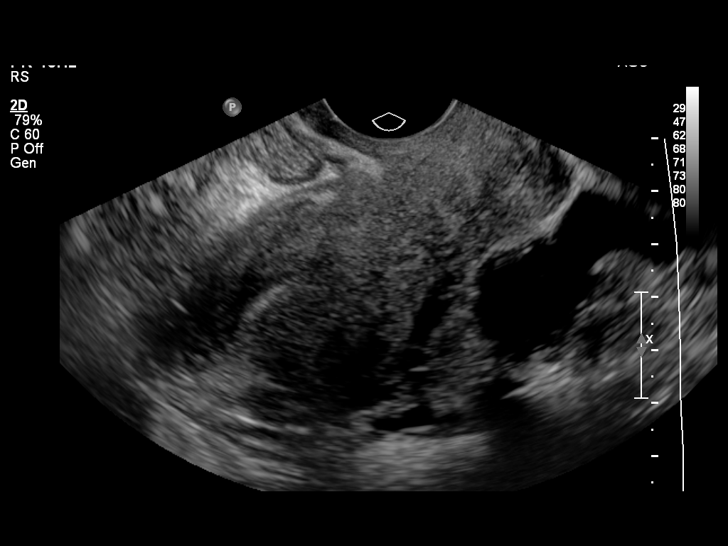
[im 8/32]
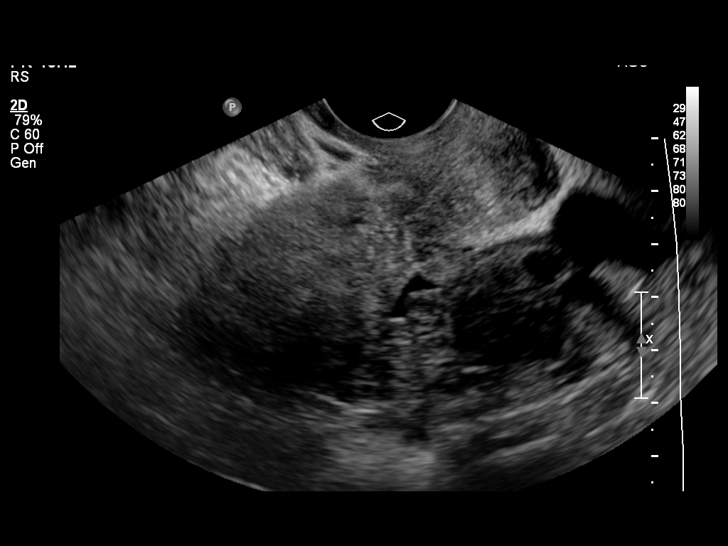
[im 11/32]
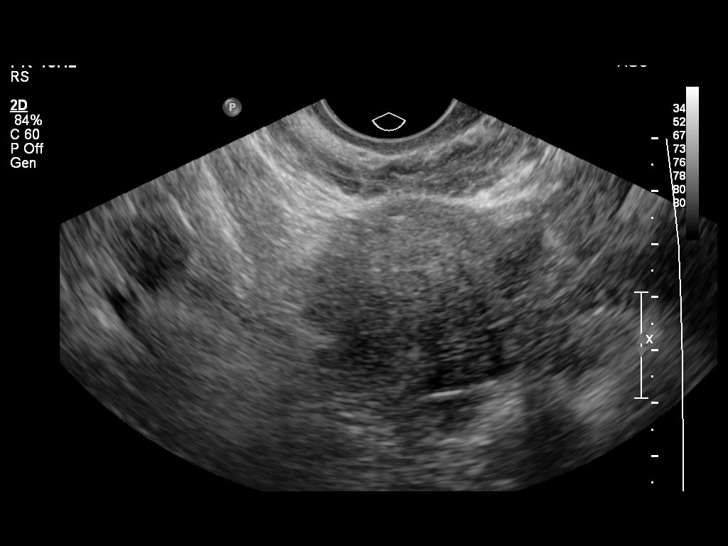
[im 13/32]
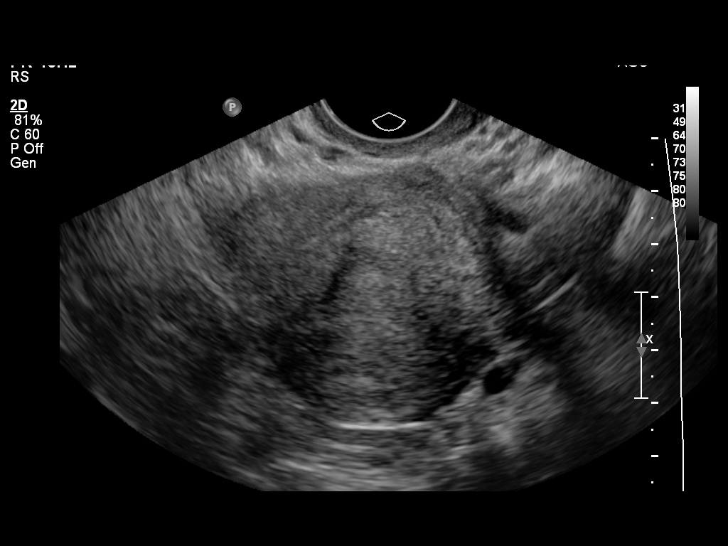
[im 16/32]
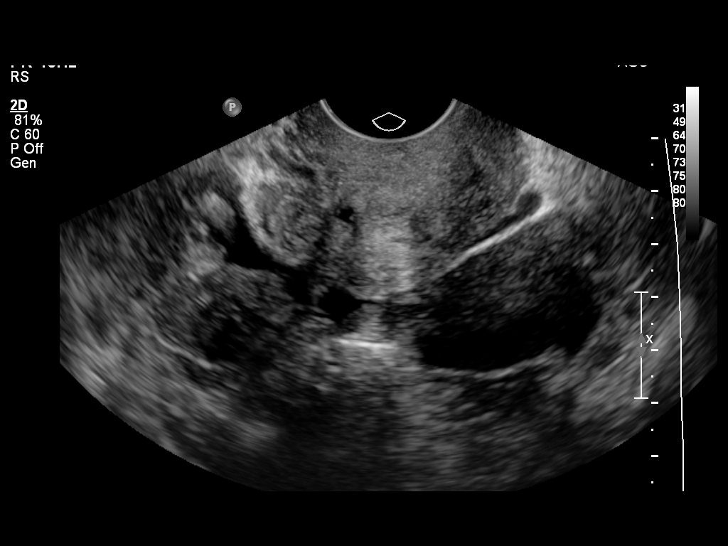
[im 19/32]
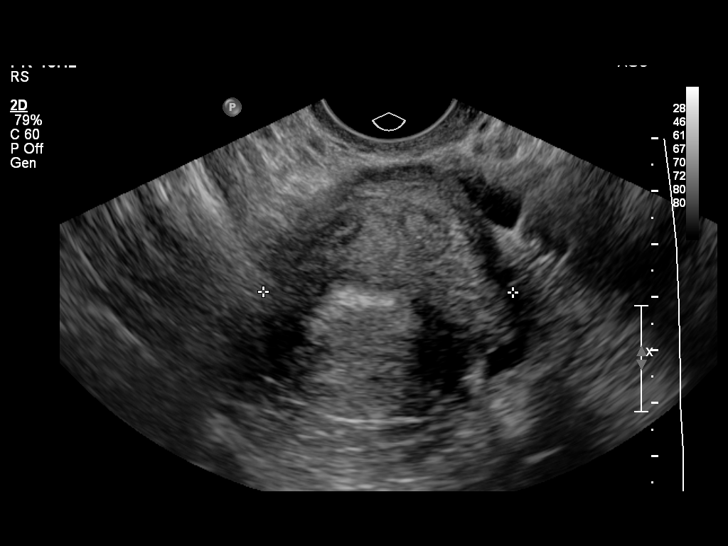
[im 21/32]
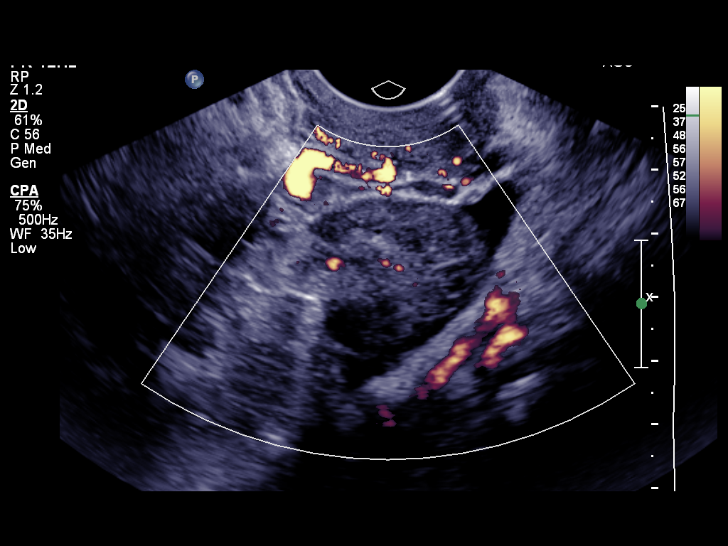
[im 24/32]
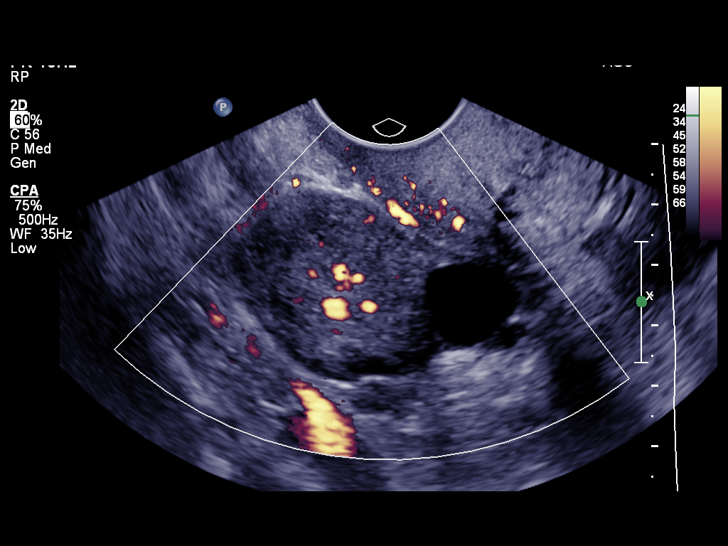
[im 26/32]
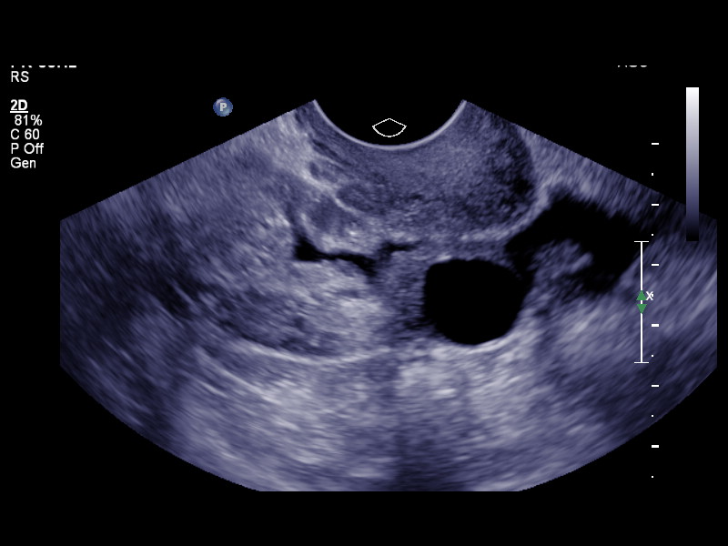
[im 29/32]
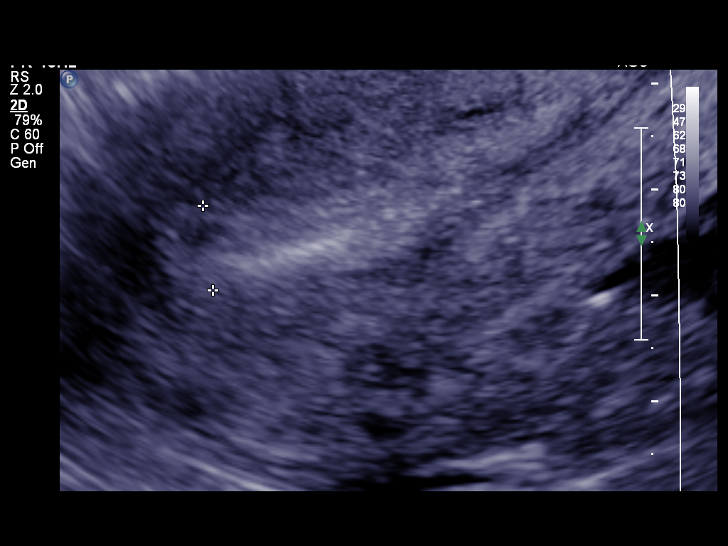
[im 32/32]
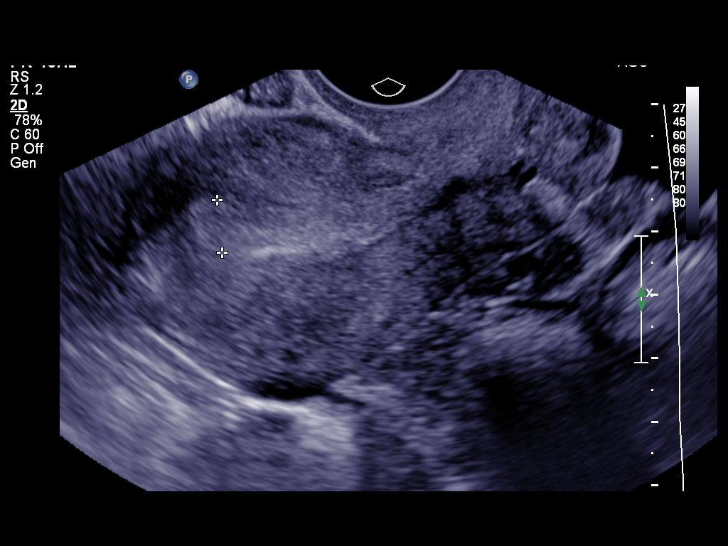

[13 of 25 positions shown; findings below may reference images not displayed]

FINDINGS: Uterus

Measurements: 8.4 x 4.7 x 4.3 cm. Anteverted, anteflexed. C-section
defect incidentally noted.. No fibroids or other mass visualized.

Endometrium

Thickness: 8 mm, borderline trilaminar in appearance. Trace fluid
noted within the fundal endometrial canal. No focal abnormality
identified allowing for technique.

Right ovary

Measurements: 4.4 x 4.1 x 2.8 cm. Normal appearance/no adnexal mass.

Left ovary

Measurements: 4.5 x 2.6 x 2.3 cm. Normal appearance/no adnexal mass.

Other findings:  Trace free fluid
IMPRESSION: Borderline trilaminar appearance to the endometrium which is
compatible with menstrual phase given prior LMP date 04/22/2014.
There is a reported clinical indication of endometrial thickening,
but a measurement of 8 mm endometrial thickness in a premenopausal
female at this phase of her menstrual cycle is within normal limits
by imaging criteria. If symptoms of vaginal bleeding persist despite
medical therapy, consider sonohysterography for further evaluation
for a possibly occult focal abnormality such as polyp or submucosal
fibroid which could be obscured due to imaging performed at mid
cycle.

## 2016-04-22 ENCOUNTER — Encounter (HOSPITAL_BASED_OUTPATIENT_CLINIC_OR_DEPARTMENT_OTHER): Payer: Self-pay

## 2016-04-22 ENCOUNTER — Emergency Department (HOSPITAL_BASED_OUTPATIENT_CLINIC_OR_DEPARTMENT_OTHER)
Admission: EM | Admit: 2016-04-22 | Discharge: 2016-04-22 | Disposition: A | Discharge status: Home / Self Care | Payer: Medicaid Other | Attending: Emergency Medicine | Admitting: Emergency Medicine

## 2016-04-22 DIAGNOSIS — M7989 Other specified soft tissue disorders: Secondary | ICD-10-CM | POA: Diagnosis present

## 2016-04-22 DIAGNOSIS — F17200 Nicotine dependence, unspecified, uncomplicated: Secondary | ICD-10-CM | POA: Insufficient documentation

## 2016-04-22 DIAGNOSIS — L03011 Cellulitis of right finger: Secondary | ICD-10-CM | POA: Diagnosis not present

## 2016-04-22 DIAGNOSIS — E119 Type 2 diabetes mellitus without complications: Secondary | ICD-10-CM | POA: Diagnosis not present

## 2016-04-22 DIAGNOSIS — Z7984 Long term (current) use of oral hypoglycemic drugs: Secondary | ICD-10-CM | POA: Insufficient documentation

## 2016-04-22 DIAGNOSIS — Z7901 Long term (current) use of anticoagulants: Secondary | ICD-10-CM | POA: Diagnosis not present

## 2016-04-22 DIAGNOSIS — Z7982 Long term (current) use of aspirin: Secondary | ICD-10-CM | POA: Diagnosis not present

## 2016-04-22 HISTORY — DX: Acute embolism and thrombosis of unspecified deep veins of unspecified lower extremity: I82.409

## 2016-04-22 MED ORDER — LIDOCAINE HCL 2 % IJ SOLN
10.0000 mL | Freq: Once | INTRAMUSCULAR | Status: AC
  Administered 2016-04-22: 200 mg
  Filled 2016-04-22: qty 20

## 2016-04-22 MED ORDER — CEPHALEXIN 500 MG PO CAPS: 500.0000 mg | ORAL_CAPSULE | Freq: Four times a day (QID) | ORAL | 0 refills | Status: AC

## 2016-04-22 MED FILL — CEPHALEXIN 500 MG CAPSULE: 500 | 5 days supply | Qty: 20 | Fill #0

## 2016-04-22 NOTE — ED Provider Notes (Signed)
[place] DEPT [facility] Provider Note   CSN: [id number] Arrival date & time: 04/22/16  1450     History   Chief Complaint Chief Complaint  Patient presents with  . Hand Pain    HPI Cassidy Jarvis is a 46 y.o. female.  The history is provided by the patient.  Hand Pain  This is a new problem. The current episode started more than 2 days ago. The problem occurs constantly. The problem has not changed since onset.Exacerbated by: moving right middle finger. Nothing relieves the symptoms. She has tried nothing for the symptoms.   Right middle finger swelling at the fingernail over past 5 days. No trauma, but thinks she may have had a hang nail there. Does not bite nails. No fever or chills. No medications tried prior to arrival. History of DVT on coumadin.  Past Medical History:  Diagnosis Date  . Diabetes mellitus   . DVT (deep venous thrombosis) ([place])   . History of blood clots     Patient Active Problem List   Diagnosis Date Noted  . Chest pain 03/01/2015  . Pain in the chest   . Primary hypercoagulable state [facility])     Past Surgical History:  Procedure Laterality Date  . VASCULAR SURGERY      OB History    Gravida Para Term Preterm AB Living   1 1 1     1    SAB TAB Ectopic Multiple Live Births                   Home Medications    Prior to Admission medications   Medication Sig Start Date End Date Taking? Authorizing Provider  GLIPIZIDE PO Take by mouth.   Yes Historical Provider, MD  Pioglitazone HCl (ACTOS PO) Take by mouth.   Yes Historical Provider, MD  aspirin EC 81 MG tablet Take 81 mg by mouth daily.    Historical Provider, MD  cephALEXin (KEFLEX) 500 MG capsule Take 1 capsule (500 mg total) by mouth 4 (four) times daily. 04/22/16 04/27/16  Forde Dandy, MD  pravastatin (PRAVACHOL) 40 MG tablet Take 40 mg by mouth at bedtime.    Historical Provider, MD  warfarin (COUMADIN) 7.5 MG tablet Take 7.5-11.25 mg by mouth every evening. Take 1 1/2 tablets (11.25  mg) by mouth on Monday, Wednesday, Friday, take 1 tablet (7.5 mg) on Sunday, Tuesday, Thursday, Saturday.    Historical Provider, MD    Family History No family history on file.  Social History Social History  Substance Use Topics  . Smoking status: Current Some Day Smoker    Packs/day: 0.00  . Smokeless tobacco: Never Used  . Alcohol use No     Allergies   Metformin and related and Other   Review of Systems Review of Systems  Constitutional: Negative for fever.  Gastrointestinal: Negative for vomiting.  Musculoskeletal:       Right middle finger pain   Skin: Negative for wound.  Allergic/Immunologic: Negative for immunocompromised state.  Hematological: Bruises/bleeds easily.     Physical Exam Updated Vital Signs BP 118/71 (BP Location: Left Arm)   Pulse 78   Temp 98.7 F (37.1 C) (Oral)   Resp 16   Ht 5' 5"  (1.651 m)   Wt 258 lb (117 kg)   LMP 04/12/2016   SpO2 100%   BMI 42.93 kg/m   Physical Exam Physical Exam  Nursing note and vitals reviewed. Constitutional: Well developed, well nourished, non-toxic, and in no acute distress  Head: Normocephalic and atraumatic.  Mouth/Throat: Oropharynx is moist Neck: Normal range of motion.  Cardiovascular: +2 radial pulse Pulmonary/Chest: Effort normal a Abdominal: Soft. There is no tenderness. There is no rebound and no guarding.  Musculoskeletal: swelling and redness around the proximal edge of right middle finger nail, no fusiform swelling of the finger, full flexion extension of finger at DIP and PIP Neurological: Alert, no facial droop, fluent speech,  Skin: Skin is warm and dry.  Psychiatric: Cooperative   ED Treatments / Results  Labs (all labs ordered are listed, but only abnormal results are displayed) Labs Reviewed - No data to display  EKG  EKG Interpretation None       Radiology No results found.  Procedures .Marland KitchenIncision and Drainage Date/Time: 04/22/2016 4:33 PM Performed by: Brantley Stage  DUO Authorized by: Brantley Stage DUO   Consent:    Consent obtained:  Verbal   Consent given by:  Patient   Risks discussed:  Bleeding and incomplete drainage   Alternatives discussed:  No treatment Location:    Indications for incision and drainage: paronychia, right middle finger.   Size:  0.5 cm   Location: right middle finger. Pre-procedure details:    Skin preparation:  Antiseptic wash and Betadine Anesthesia (see MAR for exact dosages):    Anesthesia method:  Local infiltration   Local anesthetic:  Lidocaine 2% w/o epi Procedure type:    Complexity:  Simple Procedure details:    Needle aspiration: yes     Needle size:  18 G   Incision types:  Stab incision   Incision depth:  Subcutaneous   Wound management:  Probed and deloculated   Drainage:  Purulent and bloody   Drainage amount:  Moderate   Wound treatment:  Wound left open   Packing materials:  None Post-procedure details:    Patient tolerance of procedure:  Tolerated well, no immediate complications .Nerve Block Date/Time: 04/22/2016 4:35 PM Performed by: Brantley Stage DUO Authorized by: Brantley Stage DUO   Consent:    Consent obtained:  Verbal   Consent given by:  Patient   Risks discussed:  Bleeding   Alternatives discussed:  No treatment Indications:    Indications:  Pain relief Location:    Nerve block body site: right middle finger.   Laterality:  Right Pre-procedure details:    Skin preparation:  Povidone-iodine   Preparation: Patient was prepped and draped in usual sterile fashion   Skin anesthesia (see MAR for exact dosages):    Skin anesthesia method:  Local infiltration   Local anesthetic:  Lidocaine 2% w/o epi Procedure details (see MAR for exact dosages):    Block needle gauge:  18 G   Anesthetic injected:  Lidocaine 2% w/o epi   Injection procedure:  Anatomic landmarks identified, incremental injection, introduced needle, negative aspiration for blood and anatomic landmarks palpated   Paresthesia:   None Post-procedure details:    Outcome:  Anesthesia achieved   Patient tolerance of procedure:  Tolerated well, no immediate complications   (including critical care time)    Medications Ordered in ED Medications  lidocaine (XYLOCAINE) 2 % (with pres) injection 200 mg (200 mg Infiltration Given by Other 04/22/16 1612)     Initial Impression / Assessment and Plan / ED Course  I have reviewed the triage vital signs and the nursing notes.  Pertinent labs & imaging results that were available during my care of the patient were reviewed by me and considered in my medical decision making (see chart  for details).     With paronychia of the right middle finger. Finger block administered and drained pus. Keflex prescribed. Strict return and follow-up instructions reviewed. She expressed understanding of all discharge instructions and felt comfortable with the plan of care.   Final Clinical Impressions(s) / ED Diagnoses   Final diagnoses:  Paronychia of right middle finger    New Prescriptions New Prescriptions   CEPHALEXIN (KEFLEX) 500 MG CAPSULE    Take 1 capsule (500 mg total) by mouth 4 (four) times daily.     Forde Dandy, MD 04/22/16 [phone]

## 2016-04-22 NOTE — ED Triage Notes (Signed)
c/o pain to right middle finger x 5 days-denies injury-painful to touch-swelling noted at nailbed-NAD-steady gait

## 2016-04-22 NOTE — Discharge Instructions (Signed)
Take antibiotics as prescribed. Return for worsening symptoms, including fever, worsening pain or swelling of the finger, or any other symptoms concerning to you.

## 2016-04-22 NOTE — ED Notes (Signed)
ED Provider at bedside. 

## 2016-06-12 ENCOUNTER — Other Ambulatory Visit: Payer: Self-pay | Admitting: Obstetrics and Gynecology

## 2016-06-13 LAB — CYTOLOGY - PAP

## 2017-11-27 ENCOUNTER — Emergency Department (HOSPITAL_BASED_OUTPATIENT_CLINIC_OR_DEPARTMENT_OTHER)
Admission: EM | Admit: 2017-11-27 | Discharge: 2017-11-27 | Disposition: A | Discharge status: Home / Self Care | Payer: Medicaid Other | Attending: Emergency Medicine | Admitting: Emergency Medicine

## 2017-11-27 ENCOUNTER — Other Ambulatory Visit: Payer: Self-pay

## 2017-11-27 ENCOUNTER — Encounter (HOSPITAL_BASED_OUTPATIENT_CLINIC_OR_DEPARTMENT_OTHER): Payer: Self-pay

## 2017-11-27 DIAGNOSIS — Z7901 Long term (current) use of anticoagulants: Secondary | ICD-10-CM | POA: Diagnosis not present

## 2017-11-27 DIAGNOSIS — F17200 Nicotine dependence, unspecified, uncomplicated: Secondary | ICD-10-CM | POA: Insufficient documentation

## 2017-11-27 DIAGNOSIS — Z79899 Other long term (current) drug therapy: Secondary | ICD-10-CM | POA: Insufficient documentation

## 2017-11-27 DIAGNOSIS — B9789 Other viral agents as the cause of diseases classified elsewhere: Secondary | ICD-10-CM | POA: Diagnosis not present

## 2017-11-27 DIAGNOSIS — J069 Acute upper respiratory infection, unspecified: Secondary | ICD-10-CM | POA: Insufficient documentation

## 2017-11-27 DIAGNOSIS — Z7902 Long term (current) use of antithrombotics/antiplatelets: Secondary | ICD-10-CM | POA: Insufficient documentation

## 2017-11-27 DIAGNOSIS — R05 Cough: Secondary | ICD-10-CM | POA: Diagnosis present

## 2017-11-27 DIAGNOSIS — Z7982 Long term (current) use of aspirin: Secondary | ICD-10-CM | POA: Diagnosis not present

## 2017-11-27 DIAGNOSIS — E119 Type 2 diabetes mellitus without complications: Secondary | ICD-10-CM | POA: Insufficient documentation

## 2017-11-27 MED ORDER — FLUTICASONE PROPIONATE 50 MCG/ACT NA SUSP: 1.0000 | Freq: Every day | NASAL | 0 refills | Status: DC

## 2017-11-27 MED ORDER — CETIRIZINE HCL 10 MG PO CAPS: 10.0000 mg | ORAL_CAPSULE | Freq: Every day | ORAL | 0 refills | Status: DC

## 2017-11-27 NOTE — ED Provider Notes (Signed)
[place] EMERGENCY DEPARTMENT Provider Note   CSN: [id number] Arrival date & time: 11/27/17  1309     History   Chief Complaint Chief Complaint  Patient presents with  . Cough    HPI Cassidy Jarvis is a 47 y.o. female.  47 year old female presents with complaint of congestion and pressure in both ears x2 days.  Patient is exposed to her son who has similar URI symptoms.  She reports mild nonproductive cough, denies wheezing, fevers, chills, body aches.  Patient has a history of DVT, on Coumadin, and non-insulin-dependent diabetes.  Patient is a daily smoker.  No other complaints or concerns.     Past Medical History:  Diagnosis Date  . Diabetes mellitus   . DVT (deep venous thrombosis) ([place])   . History of blood clots     Patient Active Problem List   Diagnosis Date Noted  . Chest pain 03/01/2015  . Pain in the chest   . Primary hypercoagulable state [facility])     Past Surgical History:  Procedure Laterality Date  . VASCULAR SURGERY       OB History    Gravida  1   Para  1   Term  1   Preterm      AB      Living  1     SAB      TAB      Ectopic      Multiple      Live Births               Home Medications    Prior to Admission medications   Medication Sig Start Date End Date Taking? Authorizing Provider  aspirin EC 81 MG tablet Take 81 mg by mouth daily.    [provider]  Cetirizine HCl (ZYRTEC ALLERGY) 10 MG CAPS Take 1 capsule (10 mg total) by mouth daily. 11/27/17   Tacy Learn, PA-C  fluticasone (FLONASE) 50 MCG/ACT nasal spray Place 1 spray into both nostrils daily. 11/27/17   Suella Broad A, PA-C  GLIPIZIDE PO Take by mouth.    [provider]  Pioglitazone HCl (ACTOS PO) Take by mouth.    [provider]  pravastatin (PRAVACHOL) 40 MG tablet Take 40 mg by mouth at bedtime.    [provider]  warfarin (COUMADIN) 7.5 MG tablet Take 7.5-11.25 mg by mouth every evening. Take 1  1/2 tablets (11.25 mg) by mouth on Monday, Wednesday, Friday, take 1 tablet (7.5 mg) on Sunday, Tuesday, Thursday, Saturday.    [provider]    Family History No family history on file.  Social History Social History   Tobacco Use  . Smoking status: Current Every Day Smoker    Packs/day: 0.00  . Smokeless tobacco: Never Used  Substance Use Topics  . Alcohol use: No  . Drug use: No     Allergies   Metformin and related and Other   Review of Systems Review of Systems  Constitutional: Negative for chills and fever.  HENT: Positive for congestion, ear pain, postnasal drip and sore throat. Negative for ear discharge, rhinorrhea, sinus pressure, sinus pain, sneezing, trouble swallowing and voice change.   Eyes: Negative for discharge and redness.  Respiratory: Positive for cough. Negative for shortness of breath and wheezing.   Musculoskeletal: Negative for arthralgias and myalgias.  Skin: Negative for rash and wound.  Allergic/Immunologic: Negative for immunocompromised state.  Neurological: Negative for weakness and headaches.  Hematological: Negative for adenopathy.  All other systems reviewed and are negative.    Physical Exam Updated Vital Signs BP 129/81 (BP Location: Left Arm)   Pulse 94   Temp 99.5 F (37.5 C) (Oral)   Resp 20   Ht 5' 6"  (1.676 m)   Wt 126 kg   LMP 11/03/2017   SpO2 96%   BMI 44.83 kg/m   Physical Exam  Constitutional: She is oriented to person, place, and time. She appears well-developed and well-nourished. No distress.  HENT:  Head: Normocephalic and atraumatic.  Right Ear: External ear normal. Tympanic membrane is not erythematous, not retracted and not bulging. A middle ear effusion is present.  Left Ear: External ear normal. Tympanic membrane is not erythematous, not retracted and not bulging. A middle ear effusion is present.  Nose: Mucosal edema present.  Mouth/Throat: Mucous membranes are normal. Posterior oropharyngeal  erythema present. No oropharyngeal exudate or posterior oropharyngeal edema. No tonsillar exudate.  Eyes: Conjunctivae are normal.  Neck: Neck supple.  Cardiovascular: Normal rate, regular rhythm, normal heart sounds and intact distal pulses.  No murmur heard. Pulmonary/Chest: Effort normal and breath sounds normal. No respiratory distress.  Lymphadenopathy:    She has no cervical adenopathy.  Neurological: She is alert and oriented to person, place, and time.  Skin: Skin is warm and dry. No rash noted. She is not diaphoretic.  Psychiatric: She has a normal mood and affect. Her behavior is normal.  Nursing note and vitals reviewed.    ED Treatments / Results  Labs (all labs ordered are listed, but only abnormal results are displayed) Labs Reviewed - No data to display  EKG None  Radiology No results found.  Procedures Procedures (including critical care time)  Medications Ordered in ED Medications - No data to display   Initial Impression / Assessment and Plan / ED Course  I have reviewed the triage vital signs and the nursing notes.  Pertinent labs & imaging results that were available during my care of the patient were reviewed by me and considered in my medical decision making (see chart for details).  Clinical Course as of Nov 28 1351  Fri Nov 27, 1557  [id number] 47 year old female with URI symptoms x2 days.  Patient is taking Tylenol for her symptoms, takes Coumadin, last INR was yesterday and was 1.7, as managed by her provider.  Findings consistent with likely viral URI, no acute bacterial infection requiring antibiotics today.  Recommend supportive therapy.  Recommend patient follow-up closely with PCP regarding INR monitoring.   [LM]    Clinical Course User Index [LM] Tacy Learn, PA-C   Final Clinical Impressions(s) / ED Diagnoses   Final diagnoses:  Viral URI with cough    ED Discharge Orders         Ordered    fluticasone (FLONASE) 50 MCG/ACT nasal  spray  Daily     11/27/17 1339    Cetirizine HCl (ZYRTEC ALLERGY) 10 MG CAPS  Daily     11/27/17 1339           Tacy Learn, PA-C 11/27/17 1353    Long, [place], MD 11/27/17 [phone]

## 2017-11-27 NOTE — ED Triage Notes (Signed)
C/o flu like sx x 2 days-NAD-steady gait

## 2017-11-27 NOTE — Discharge Instructions (Addendum)
Saline sinus rinse twice daily. Flonase and Zyrtec daily as directed. Take Coricidin HBP as directed. Follow-up with your primary care provider for monitoring of your INR while on cough and cold medications.

## 2018-12-01 ENCOUNTER — Other Ambulatory Visit: Payer: Self-pay | Admitting: Obstetrics and Gynecology

## 2018-12-01 DIAGNOSIS — R928 Other abnormal and inconclusive findings on diagnostic imaging of breast: Secondary | ICD-10-CM

## 2018-12-03 ENCOUNTER — Other Ambulatory Visit: Payer: Self-pay | Admitting: Obstetrics and Gynecology

## 2018-12-03 DIAGNOSIS — N63 Unspecified lump in unspecified breast: Secondary | ICD-10-CM

## 2018-12-07 ENCOUNTER — Other Ambulatory Visit: Payer: Medicaid Other

## 2018-12-12 ENCOUNTER — Emergency Department (HOSPITAL_BASED_OUTPATIENT_CLINIC_OR_DEPARTMENT_OTHER)
Admission: EM | Admit: 2018-12-12 | Discharge: 2018-12-12 | Disposition: A | Discharge status: Home / Self Care | Payer: Medicaid Other | Attending: Emergency Medicine | Admitting: Emergency Medicine

## 2018-12-12 ENCOUNTER — Other Ambulatory Visit: Payer: Self-pay

## 2018-12-12 ENCOUNTER — Encounter (HOSPITAL_BASED_OUTPATIENT_CLINIC_OR_DEPARTMENT_OTHER): Payer: Self-pay | Admitting: Emergency Medicine

## 2018-12-12 DIAGNOSIS — E119 Type 2 diabetes mellitus without complications: Secondary | ICD-10-CM | POA: Insufficient documentation

## 2018-12-12 DIAGNOSIS — Z79899 Other long term (current) drug therapy: Secondary | ICD-10-CM | POA: Diagnosis not present

## 2018-12-12 DIAGNOSIS — F17200 Nicotine dependence, unspecified, uncomplicated: Secondary | ICD-10-CM | POA: Diagnosis not present

## 2018-12-12 DIAGNOSIS — Z86718 Personal history of other venous thrombosis and embolism: Secondary | ICD-10-CM | POA: Diagnosis not present

## 2018-12-12 DIAGNOSIS — Z7982 Long term (current) use of aspirin: Secondary | ICD-10-CM | POA: Diagnosis not present

## 2018-12-12 DIAGNOSIS — M5442 Lumbago with sciatica, left side: Secondary | ICD-10-CM | POA: Insufficient documentation

## 2018-12-12 DIAGNOSIS — Z7901 Long term (current) use of anticoagulants: Secondary | ICD-10-CM | POA: Diagnosis not present

## 2018-12-12 DIAGNOSIS — Z7984 Long term (current) use of oral hypoglycemic drugs: Secondary | ICD-10-CM | POA: Diagnosis not present

## 2018-12-12 DIAGNOSIS — M5489 Other dorsalgia: Secondary | ICD-10-CM | POA: Diagnosis present

## 2018-12-12 MED ORDER — METHOCARBAMOL 500 MG PO TABS: 500.0000 mg | ORAL_TABLET | Freq: Two times a day (BID) | ORAL | 0 refills | Status: DC

## 2018-12-12 MED ORDER — PREDNISONE 10 MG (21) PO TBPK: ORAL_TABLET | ORAL | 0 refills | Status: DC

## 2018-12-12 MED ORDER — LIDOCAINE 5 % EX PTCH: 1.0000 | MEDICATED_PATCH | CUTANEOUS | 0 refills | Status: DC

## 2018-12-12 NOTE — ED Provider Notes (Signed)
[place] EMERGENCY DEPARTMENT Provider Note   CSN: [id number] Arrival date & time: 12/12/18  1148     History   Chief Complaint Chief Complaint  Patient presents with  . Back Pain    HPI Cassidy Jarvis is a 48 y.o. female.     HPI   Cassidy Jarvis is a 48 y.o. female, with a history of obesity, DM, DVT, presenting to the ED with pain in the left lower back extending down the left leg for about the last 3 days.  This started after wearing a different pair of shoes.  Moderate to severe in intensity.  She is taking Tylenol for pain.  States her symptoms are consistent with sciatica flares she has experienced in the past. She has already scheduled an appointment with her PCP this week. She also has a relationship established with an orthopedist in [facility], but does not remember the name at this time.  Denies fever/chills, urinary symptoms, falls/trauma, abdominal pain, numbness, weakness, or any other complaints.    Past Medical History:  Diagnosis Date  . Diabetes mellitus   . DVT (deep venous thrombosis) ([place])   . History of blood clots     Patient Active Problem List   Diagnosis Date Noted  . Chest pain 03/01/2015  . Pain in the chest   . Primary hypercoagulable state [facility])     Past Surgical History:  Procedure Laterality Date  . VASCULAR SURGERY       OB History    Gravida  1   Para  1   Term  1   Preterm      AB      Living  1     SAB      TAB      Ectopic      Multiple      Live Births               Home Medications    Prior to Admission medications   Medication Sig Start Date End Date Taking? Authorizing Provider  aspirin EC 81 MG tablet Take 81 mg by mouth daily.    [provider]  Cetirizine HCl (ZYRTEC ALLERGY) 10 MG CAPS Take 1 capsule (10 mg total) by mouth daily. 11/27/17   Tacy Learn, PA-C  fluticasone (FLONASE) 50 MCG/ACT nasal spray Place 1 spray into both nostrils daily. 11/27/17   Suella Broad A, PA-C  GLIPIZIDE PO Take by mouth.    [provider]  lidocaine (LIDODERM) 5 % Place 1 patch onto the skin daily. Remove & Discard patch within 12 hours or as directed by MD 12/12/18   Pegah Segel C, PA-C  methocarbamol (ROBAXIN) 500 MG tablet Take 1 tablet (500 mg total) by mouth 2 (two) times daily. 12/12/18   Krishay Faro C, PA-C  Pioglitazone HCl (ACTOS PO) Take by mouth.    [provider]  pravastatin (PRAVACHOL) 40 MG tablet Take 40 mg by mouth at bedtime.    [provider]  predniSONE (STERAPRED UNI-PAK 21 TAB) 10 MG (21) TBPK tablet Take 6 tabs (16m) day 1, 5 tabs (558m day 2, 4 tabs (4033mday 3, 3 tabs (2m73may 4, 2 tabs (20mg73my 5, and 1 tab (10mg)22m 6. 12/12/18   Haidy Kackley C, PA-C  warfarin (COUMADIN) 7.5 MG tablet Take 7.5-11.25 mg by mouth every evening. Take 1 1/2 tablets (11.25 mg) by mouth on Monday, Wednesday, Friday, take 1 tablet (7.5  mg) on Sunday, Tuesday, Thursday, Saturday.    [provider]    Family History No family history on file.  Social History Social History   Tobacco Use  . Smoking status: Current Every Day Smoker    Packs/day: 0.00  . Smokeless tobacco: Never Used  Substance Use Topics  . Alcohol use: No  . Drug use: No     Allergies   Metformin and related and Other   Review of Systems Review of Systems  Constitutional: Negative for chills and fever.  Respiratory: Negative for shortness of breath.   Cardiovascular: Negative for chest pain.  Gastrointestinal: Negative for abdominal pain, nausea and vomiting.  Genitourinary: Negative for dysuria, flank pain and hematuria.  Musculoskeletal: Positive for back pain.  Neurological: Negative for weakness and numbness.  All other systems reviewed and are negative.    Physical Exam Updated Vital Signs BP 137/77 (BP Location: Right Arm)   Pulse 97   Temp 99.2 F (37.3 C) (Oral)   Resp 16   Ht 5' 6"  (1.676 m)   Wt 130.6 kg   SpO2 100%   BMI  46.48 kg/m   Physical Exam Vitals signs and nursing note reviewed.  Constitutional:      General: She is not in acute distress.    Appearance: She is well-developed. She is not diaphoretic.  HENT:     Head: Normocephalic and atraumatic.     Mouth/Throat:     Mouth: Mucous membranes are moist.     Pharynx: Oropharynx is clear.  Eyes:     Conjunctiva/sclera: Conjunctivae normal.  Neck:     Musculoskeletal: Neck supple.  Cardiovascular:     Rate and Rhythm: Normal rate and regular rhythm.     Pulses: Normal pulses.          Radial pulses are 2+ on the right side and 2+ on the left side.       Posterior tibial pulses are 2+ on the right side and 2+ on the left side.     Comments: Tactile temperature in the extremities appropriate and equal bilaterally. Pulmonary:     Effort: Pulmonary effort is normal. No respiratory distress.  Abdominal:     Palpations: Abdomen is soft.     Tenderness: There is no abdominal tenderness. There is no guarding.  Musculoskeletal:     Right lower leg: No edema.     Left lower leg: No edema.     Comments: Tenderness in left lumbar musculature into the left buttocks. Range of motion intact in the left lower extremity.  Lymphadenopathy:     Cervical: No cervical adenopathy.  Skin:    General: Skin is warm and dry.  Neurological:     Mental Status: She is alert.     Comments: Sensation grossly intact to light touch in the lower extremities bilaterally. No saddle anesthesias. Strength 5/5 in the bilateral lower extremities. Antalgic, but stable gait.  Psychiatric:        Mood and Affect: Mood and affect normal.        Speech: Speech normal.        Behavior: Behavior normal.      ED Treatments / Results  Labs (all labs ordered are listed, but only abnormal results are displayed) Labs Reviewed - No data to display  EKG None  Radiology No results found.  Procedures Procedures (including critical care time)  Medications Ordered in ED  Medications - No data to display   Initial Impression / Assessment  and Plan / ED Course  I have reviewed the triage vital signs and the nursing notes.  Pertinent labs & imaging results that were available during my care of the patient were reviewed by me and considered in my medical decision making (see chart for details).        Patient presents with lower back pain in a pattern suggestive of sciatica.  No focal neuro deficits.  Exam is not suggestive of neurosurgical emergency at this time. We discussed many different options for treatment of the patient's pain. She has close follow-up already scheduled. The patient was given instructions for home care as well as return precautions. Patient voices understanding of these instructions, accepts the plan, and is comfortable with discharge.    Final Clinical Impressions(s) / ED Diagnoses   Final diagnoses:  Acute left-sided low back pain with left-sided sciatica    ED Discharge Orders         Ordered    methocarbamol (ROBAXIN) 500 MG tablet  2 times daily     12/12/18 1543    predniSONE (STERAPRED UNI-PAK 21 TAB) 10 MG (21) TBPK tablet     12/12/18 1543    lidocaine (LIDODERM) 5 %  Every 24 hours     12/12/18 [place], Jaber Dunlow C, PA-C 12/12/18 1605    Virgel Manifold, MD 12/13/18 1115

## 2018-12-12 NOTE — Discharge Instructions (Addendum)
Expect your soreness to increase over the next 2-3 days. Take it easy, but do not lay around too much as this may make any stiffness worse.  Acetaminophen: May take acetaminophen (generic for Tylenol), as needed, for pain. Your daily total maximum amount of acetaminophen from all sources should be limited to 4051m/day for persons without liver problems, or 20029mday for those with liver problems. Methocarbamol: Methocarbamol (generic for Robaxin) is a muscle relaxer and can help relieve stiff muscles or muscle spasms.  Do not drive or perform other dangerous activities while taking this medication as it can cause drowsiness as well as changes in reaction time and judgement. Lidocaine patches: These are available via either prescription or over-the-counter. The over-the-counter option may be more economical one and are likely just as effective. There are multiple over-the-counter brands, such as Salonpas. Exercises: Be sure to perform the attached exercises starting with three times a week and working up to performing them daily. This is an essential part of preventing long term problems.  Follow up: Follow up with a primary care provider for any future management of these complaints. Be sure to follow up within 7-10 days. Return: Return to the ED should symptoms worsen.  For prescription assistance, may try using prescription discount sites or apps, such as goodrx.com

## 2018-12-12 NOTE — ED Triage Notes (Signed)
L side back pain radiating down her leg x 3 days. It started after she wore different shoes.

## 2018-12-17 ENCOUNTER — Other Ambulatory Visit: Payer: Medicaid Other

## 2018-12-29 ENCOUNTER — Other Ambulatory Visit: Payer: Medicaid Other

## 2019-01-10 ENCOUNTER — Other Ambulatory Visit: Payer: Medicaid Other

## 2020-11-28 ENCOUNTER — Emergency Department (HOSPITAL_BASED_OUTPATIENT_CLINIC_OR_DEPARTMENT_OTHER)
Admission: EM | Admit: 2020-11-28 | Discharge: 2020-11-29 | Disposition: A | Discharge status: Home / Self Care | Payer: Medicaid Other | Attending: Emergency Medicine | Admitting: Emergency Medicine

## 2020-11-28 ENCOUNTER — Encounter (HOSPITAL_BASED_OUTPATIENT_CLINIC_OR_DEPARTMENT_OTHER): Payer: Self-pay | Admitting: *Deleted

## 2020-11-28 ENCOUNTER — Other Ambulatory Visit: Payer: Self-pay

## 2020-11-28 DIAGNOSIS — J09X9 Influenza due to identified novel influenza A virus with other manifestations: Secondary | ICD-10-CM | POA: Insufficient documentation

## 2020-11-28 DIAGNOSIS — J101 Influenza due to other identified influenza virus with other respiratory manifestations: Secondary | ICD-10-CM

## 2020-11-28 DIAGNOSIS — R509 Fever, unspecified: Secondary | ICD-10-CM | POA: Diagnosis not present

## 2020-11-28 DIAGNOSIS — Z20822 Contact with and (suspected) exposure to covid-19: Secondary | ICD-10-CM | POA: Insufficient documentation

## 2020-11-28 LAB — RESP PANEL BY RT-PCR (FLU A&B, COVID) ARPGX2
Influenza A by PCR: POSITIVE — AB
Influenza B by PCR: NEGATIVE
SARS Coronavirus 2 by RT PCR: NEGATIVE

## 2020-11-28 MED ORDER — OSELTAMIVIR PHOSPHATE 75 MG PO CAPS
75.0000 mg | ORAL_CAPSULE | Freq: Once | ORAL | Status: AC
  Administered 2020-11-29: 75 mg via ORAL
  Filled 2020-11-28: qty 1

## 2020-11-28 MED ORDER — ONDANSETRON 4 MG PO TBDP
8.0000 mg | ORAL_TABLET | Freq: Once | ORAL | Status: AC
  Administered 2020-11-29: 8 mg via ORAL
  Filled 2020-11-28: qty 2

## 2020-11-28 MED ORDER — HYDROCOD POLST-CPM POLST ER 10-8 MG/5ML PO SUER
5.0000 mL | Freq: Once | ORAL | Status: AC
  Administered 2020-11-29: 5 mL via ORAL
  Filled 2020-11-28: qty 5

## 2020-11-28 NOTE — ED Provider Notes (Signed)
[place] DEPT [facility] Provider Note: Georgena Spurling, MD, FACEP  CSN: [id number] MRN: [id number] ARRIVAL: 11/28/20 at 2219 ROOM: [facility]07/[facility]07   CHIEF COMPLAINT  Fever   HISTORY OF PRESENT ILLNESS  11/28/20 11:52 PM Cassidy Jarvis is a 50 y.o. female whose son was diagnosed with influenza today after being sick for about 8 to 10 days.  She is here with flulike symptoms that began 2 days ago.  Specifically she has fever, cough, headache, sore throat, ear pain and general malaise.  She has had nausea, vomiting and diarrhea with this as well.  She rates her pain as a 5 out of 10.  Her symptoms are severe enough she had to leave work today.   Past Medical History:  Diagnosis Date   Diabetes mellitus    DVT (deep venous thrombosis) ([facility])    History of blood clots     Past Surgical History:  Procedure Laterality Date   VASCULAR SURGERY      No family history on file.  Social History   Tobacco Use   Smoking status: Every Day    Packs/day: 0.00    Types: Cigarettes   Smokeless tobacco: Never  Substance Use Topics   Alcohol use: No   Drug use: No    Prior to Admission medications   Medication Sig Start Date End Date Taking? Authorizing Provider  chlorpheniramine-HYDROcodone (TUSSIONEX PENNKINETIC ER) 10-8 MG/5ML SUER Take 5 mLs by mouth every 12 (twelve) hours as needed for cough. 11/28/20  Yes Reha Martinovich, MD  ondansetron (ZOFRAN ODT) 8 MG disintegrating tablet Take 1 tablet (8 mg total) by mouth every 8 (eight) hours as needed for vomiting or nausea. 11/28/20  Yes Cyan Clippinger, MD  oseltamivir (TAMIFLU) 75 MG capsule Take 1 capsule (75 mg total) by mouth every 12 (twelve) hours. 11/28/20  Yes Lashara Urey, MD  aspirin EC 81 MG tablet Take 81 mg by mouth daily.    [provider]  Cetirizine HCl (ZYRTEC ALLERGY) 10 MG CAPS Take 1 capsule (10 mg total) by mouth daily. 11/27/17   Tacy Learn, PA-C  fluticasone (FLONASE) 50 MCG/ACT nasal spray Place 1 spray into  both nostrils daily. 11/27/17   Suella Broad A, PA-C  GLIPIZIDE PO Take by mouth.    [provider]  Pioglitazone HCl (ACTOS PO) Take by mouth.    [provider]  pravastatin (PRAVACHOL) 40 MG tablet Take 40 mg by mouth at bedtime.    [provider]  warfarin (COUMADIN) 7.5 MG tablet Take 7.5-11.25 mg by mouth every evening. Take 1 1/2 tablets (11.25 mg) by mouth on Monday, Wednesday, Friday, take 1 tablet (7.5 mg) on Sunday, Tuesday, Thursday, Saturday.    [provider]    Allergies Metformin and related and Other   REVIEW OF SYSTEMS  Negative except as noted here or in the History of Present Illness.   PHYSICAL EXAMINATION  Initial Vital Signs Blood pressure 126/76, pulse 95, temperature 98.9 F (37.2 C), temperature source Oral, resp. rate 16, height 5' 6"  (1.676 m), weight 121.1 kg, last menstrual period 11/08/2020, SpO2 98 %.  Examination General: Well-developed, well-nourished female in no acute distress; appearance consistent with age of record HENT: normocephalic; atraumatic; no pharyngeal erythema or exudate; TMs normal Eyes: pupils equal, round and reactive to light; extraocular muscles intact Neck: supple Heart: regular rate and rhythm Lungs: clear to auscultation bilaterally Abdomen: soft; nondistended; nontender; bowel sounds present Extremities: No deformity; full range of motion; pulses normal Neurologic:  Awake, alert and oriented; motor function intact in all extremities and symmetric; no facial droop Skin: Warm and dry Psychiatric: Normal mood and affect   RESULTS  Summary of this visit's results, reviewed and interpreted by myself:   EKG Interpretation  Date/Time:    Ventricular Rate:    PR Interval:    QRS Duration:   QT Interval:    QTC Calculation:   R Axis:     Text Interpretation:         Laboratory Studies: Results for orders placed or performed during the hospital encounter of 11/28/20 (from the  past 24 hour(s))  Resp Panel by RT-PCR (Flu A&B, Covid) Nasopharyngeal Swab     Status: Abnormal   Collection Time: 11/28/20 10:38 PM   Specimen: Nasopharyngeal Swab; Nasopharyngeal(NP) swabs in vial transport medium  Result Value Ref Range   SARS Coronavirus 2 by RT PCR NEGATIVE NEGATIVE   Influenza A by PCR POSITIVE (A) NEGATIVE   Influenza B by PCR NEGATIVE NEGATIVE   Imaging Studies: No results found.  ED COURSE and MDM  Nursing notes, initial and subsequent vitals signs, including pulse oximetry, reviewed and interpreted by myself.  Vitals:   11/28/20 2232 11/28/20 2233  BP: 126/76   Pulse: 95   Resp: 16   Temp: 98.9 F (37.2 C)   TempSrc: Oral   SpO2: 98%   Weight:  121.1 kg  Height:  5' 6"  (1.676 m)   Medications  chlorpheniramine-HYDROcodone (TUSSIONEX) 10-8 MG/5ML suspension 5 mL (has no administration in time range)  ondansetron (ZOFRAN-ODT) disintegrating tablet 8 mg (has no administration in time range)  oseltamivir (TAMIFLU) capsule 75 mg (has no administration in time range)   The patient is amenable to starting Tamiflu.  We will also treat with Tussionex and Zofran for symptom relief.  She was advised she may take over-the-counter Imodium as needed for diarrhea.   PROCEDURES  Procedures   ED DIAGNOSES     ICD-10-CM   1. Influenza A  J10.1          Wynona Duhamel, MD 11/29/20 0000

## 2020-11-28 NOTE — ED Triage Notes (Signed)
C/o fever , cough , h/a  x 3 days

## 2020-11-29 MED ORDER — ONDANSETRON 8 MG PO TBDP: 8.0000 mg | ORAL_TABLET | Freq: Three times a day (TID) | ORAL | 0 refills | Status: AC | PRN

## 2020-11-29 MED ORDER — HYDROCOD POLST-CPM POLST ER 10-8 MG/5ML PO SUER: 5.0000 mL | Freq: Two times a day (BID) | ORAL | 0 refills | Status: DC | PRN

## 2020-11-29 MED ORDER — OSELTAMIVIR PHOSPHATE 75 MG PO CAPS: 75.0000 mg | ORAL_CAPSULE | Freq: Two times a day (BID) | ORAL | 0 refills | Status: AC

## 2021-04-06 ENCOUNTER — Encounter (HOSPITAL_BASED_OUTPATIENT_CLINIC_OR_DEPARTMENT_OTHER): Payer: Self-pay | Admitting: Emergency Medicine

## 2021-04-06 ENCOUNTER — Other Ambulatory Visit: Payer: Self-pay

## 2021-04-06 ENCOUNTER — Emergency Department (HOSPITAL_BASED_OUTPATIENT_CLINIC_OR_DEPARTMENT_OTHER)
Admission: EM | Admit: 2021-04-06 | Discharge: 2021-04-06 | Disposition: A | Discharge status: Home / Self Care | Payer: Medicaid Other | Attending: Emergency Medicine | Admitting: Emergency Medicine

## 2021-04-06 ENCOUNTER — Emergency Department (HOSPITAL_BASED_OUTPATIENT_CLINIC_OR_DEPARTMENT_OTHER): Payer: Medicaid Other

## 2021-04-06 DIAGNOSIS — F17200 Nicotine dependence, unspecified, uncomplicated: Secondary | ICD-10-CM | POA: Insufficient documentation

## 2021-04-06 DIAGNOSIS — Z7982 Long term (current) use of aspirin: Secondary | ICD-10-CM | POA: Diagnosis not present

## 2021-04-06 DIAGNOSIS — Z7902 Long term (current) use of antithrombotics/antiplatelets: Secondary | ICD-10-CM | POA: Diagnosis not present

## 2021-04-06 DIAGNOSIS — Z7984 Long term (current) use of oral hypoglycemic drugs: Secondary | ICD-10-CM | POA: Insufficient documentation

## 2021-04-06 DIAGNOSIS — R0789 Other chest pain: Secondary | ICD-10-CM | POA: Diagnosis present

## 2021-04-06 DIAGNOSIS — E119 Type 2 diabetes mellitus without complications: Secondary | ICD-10-CM | POA: Diagnosis not present

## 2021-04-06 DIAGNOSIS — R079 Chest pain, unspecified: Secondary | ICD-10-CM

## 2021-04-06 DIAGNOSIS — I10 Essential (primary) hypertension: Secondary | ICD-10-CM | POA: Insufficient documentation

## 2021-04-06 DIAGNOSIS — Z79899 Other long term (current) drug therapy: Secondary | ICD-10-CM | POA: Insufficient documentation

## 2021-04-06 LAB — CBC
HCT: 40.2 % (ref 36.0–46.0)
Hemoglobin: 13.4 g/dL (ref 12.0–15.0)
MCH: 31.8 pg (ref 26.0–34.0)
MCHC: 33.3 g/dL (ref 30.0–36.0)
MCV: 95.5 fL (ref 80.0–100.0)
Platelets: 308 10*3/uL (ref 150–400)
RBC: 4.21 MIL/uL (ref 3.87–5.11)
RDW: 13.5 % (ref 11.5–15.5)
WBC: 8.2 10*3/uL (ref 4.0–10.5)
nRBC: 0 % (ref 0.0–0.2)

## 2021-04-06 LAB — BASIC METABOLIC PANEL
Anion gap: 8 (ref 5–15)
BUN: 9 mg/dL (ref 6–20)
CO2: 23 mmol/L (ref 22–32)
Calcium: 8.4 mg/dL — ABNORMAL LOW (ref 8.9–10.3)
Chloride: 103 mmol/L (ref 98–111)
Creatinine, Ser: 0.79 mg/dL (ref 0.44–1.00)
GFR, Estimated: >60 mL/min (ref >60)
Glucose, Bld: 166 mg/dL — ABNORMAL HIGH (ref 70–99)
Potassium: 3.6 mmol/L (ref 3.5–5.1)
Sodium: 134 mmol/L — ABNORMAL LOW (ref 135–145)

## 2021-04-06 LAB — TROPONIN I (HIGH SENSITIVITY): Troponin I (High Sensitivity): 3 ng/L (ref <18)

## 2021-04-06 MED ORDER — IOHEXOL 350 MG/ML SOLN
100.0000 mL | Freq: Once | INTRAVENOUS | Status: AC | PRN
  Administered 2021-04-06: 100 mL via INTRAVENOUS

## 2021-04-06 MED ORDER — DICLOFENAC SODIUM 1 % EX GEL: 4.0000 g | Freq: Four times a day (QID) | CUTANEOUS | 0 refills | Status: DC

## 2021-04-06 NOTE — ED Notes (Signed)
Dr. Tyrone Nine in to discuss d/c instructions with pt prior to leaving dept. Pt verbalized understanding to pick up prescriptions at pharmacy listed on d/c instructions.  ?

## 2021-04-06 NOTE — ED Notes (Signed)
ED Provider at bedside. Dr. Tyrone Nine ?

## 2021-04-06 NOTE — Discharge Instructions (Addendum)
You do not have a new PE.  ? ?Use the gel as prescribed.  Follow up with your doctor in the office.  ?Also take tylenol 1072m(2 extra strength) four times a day.  ? ? ?

## 2021-04-06 NOTE — ED Provider Notes (Signed)
[place] EMERGENCY DEPARTMENT Provider Note   CSN: [id number] Arrival date & time: 04/06/21  1633     History  Chief Complaint  Patient presents with   Chest Pain    Cassidy Jarvis is a 51 y.o. female.  51 yo F with a chief complaints of chest discomfort.  This is left-sided and she describes it as a band that wraps from the front to the back on the left side in the sagittal plane.  Nothing seems to make it better or worse and then she tells me that certain positions like lying back flat significantly bit worse.  She denies trauma denies cough congestion or fever.  Symptoms have been going on just since this morning.  She has a history of hypertension hyperlipidemia diabetes and is an active smoker.  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  She has missed 1 dose recently.  She thinks this feels similar to when she had a PE in the past.   Chest Pain     Home Medications Prior to Admission medications   Medication Sig Start Date End Date Taking? Authorizing Provider  diclofenac Sodium (VOLTAREN) 1 % GEL Apply 4 g topically 4 (four) times daily. 04/06/21  Yes Deno Etienne, DO  aspirin EC 81 MG tablet Take 81 mg by mouth daily.    [provider]  Cetirizine HCl (ZYRTEC ALLERGY) 10 MG CAPS Take 1 capsule (10 mg total) by mouth daily. 11/27/17   Tacy Learn, PA-C  chlorpheniramine-HYDROcodone (TUSSIONEX PENNKINETIC ER) 10-8 MG/5ML SUER Take 5 mLs by mouth every 12 (twelve) hours as needed for cough. 11/28/20   Molpus, John, MD  fluticasone (FLONASE) 50 MCG/ACT nasal spray Place 1 spray into both nostrils daily. 11/27/17   Suella Broad A, PA-C  GLIPIZIDE PO Take by mouth.    [provider]  ondansetron (ZOFRAN ODT) 8 MG disintegrating tablet Take 1 tablet (8 mg total) by mouth every 8 (eight) hours as needed for vomiting or nausea. 11/28/20   Molpus, John, MD  oseltamivir (TAMIFLU) 75 MG capsule Take 1 capsule (75 mg total) by  mouth every 12 (twelve) hours. 11/28/20   Molpus, John, MD  Pioglitazone HCl (ACTOS PO) Take by mouth.    [provider]  pravastatin (PRAVACHOL) 40 MG tablet Take 40 mg by mouth at bedtime.    [provider]  warfarin (COUMADIN) 7.5 MG tablet Take 7.5-11.25 mg by mouth every evening. Take 1 1/2 tablets (11.25 mg) by mouth on Monday, Wednesday, Friday, take 1 tablet (7.5 mg) on Sunday, Tuesday, Thursday, Saturday.    [provider]      Allergies    Metformin and related and Other    Review of Systems   Review of Systems  Cardiovascular:  Positive for chest pain.   Physical Exam Updated Vital Signs BP 138/74    Pulse (!) 59    Temp 98.4 F (36.9 C) (Oral)    Resp (!) 25    Ht 5' 6"  (1.676 m)    Wt 117 kg    LMP 03/30/2021    SpO2 100%    BMI 41.64 kg/m  Physical Exam Vitals and nursing note reviewed.  Constitutional:      General: She is not in acute distress.    Appearance: She is well-developed. She is not diaphoretic.  HENT:     Head: Normocephalic and atraumatic.  Eyes:  Pupils: Pupils are equal, round, and reactive to light.  Cardiovascular:     Rate and Rhythm: Normal rate and regular rhythm.     Heart sounds: No murmur heard.   No friction rub. No gallop.  Pulmonary:     Effort: Pulmonary effort is normal.     Breath sounds: No wheezing or rales.  Chest:     Chest wall: Tenderness present.     Comments: Pain along the left chest wall adjacent to the sternum about ribs 5 through 6 reproduces her symptoms. Abdominal:     General: There is no distension.     Palpations: Abdomen is soft.     Tenderness: There is no abdominal tenderness.  Musculoskeletal:        General: No tenderness.     Cervical back: Normal range of motion and neck supple.  Skin:    General: Skin is warm and dry.  Neurological:     Mental Status: She is alert and oriented to person, place, and time.  Psychiatric:        Behavior: Behavior normal.    ED Results  / Procedures / Treatments   Labs (all labs ordered are listed, but only abnormal results are displayed) Labs Reviewed  BASIC METABOLIC PANEL - Abnormal; Notable for the following components:      Result Value   Sodium 134 (*)    Glucose, Bld 166 (*)    Calcium 8.4 (*)    All other components within normal limits  CBC  TROPONIN I (HIGH SENSITIVITY)    EKG EKG Interpretation  Date/Time:  Saturday April 06 2021 16:41:11 EST Ventricular Rate:  64 PR Interval:  166 QRS Duration: 68 QT Interval:  380 QTC Calculation: 392 R Axis:   0 Text Interpretation: Normal sinus rhythm Cannot rule out Anterior infarct , age undetermined Abnormal ECG No significant change since last tracing Confirmed by Deno Etienne [phone]) on 04/06/2021 6:04:53 PM  Radiology DG Chest 2 View  Result Date: 04/06/2021 CLINICAL DATA:  CP EXAM: CHEST - 2 VIEW COMPARISON:  February 23, 2020 FINDINGS: The cardiomediastinal silhouette is normal in contour. No pleural effusion. No pneumothorax. No acute pleuroparenchymal abnormality. Visualized abdomen is unremarkable. No acute osseous abnormality noted. IMPRESSION: No acute cardiopulmonary abnormality. Electronically Signed   By: Valentino Saxon M.D.   On: 04/06/2021 17:03   CT Angio Chest PE W and/or Wo Contrast  Result Date: 04/06/2021 CLINICAL DATA:  Pulmonary embolism (PE) suspected, high prob. Patient has a history of prior pulmonary embolism EXAM: CT ANGIOGRAPHY CHEST WITH CONTRAST TECHNIQUE: Multidetector CT imaging of the chest was performed using the standard protocol during bolus administration of intravenous contrast. Multiplanar CT image reconstructions and MIPs were obtained to evaluate the vascular anatomy. RADIATION DOSE REDUCTION: This exam was performed according to the departmental dose-optimization program which includes automated exposure control, adjustment of the mA and/or kV according to patient size and/or use of iterative reconstruction technique.  CONTRAST:  118m OMNIPAQUE IOHEXOL 350 MG/ML SOLN COMPARISON:  February 24, 2020, October 03, 2009 FINDINGS: Cardiovascular: Satisfactory contrast opacification. There is a thin filling defect within the RIGHT interlobar pulmonary artery extending into the RIGHT lower and RIGHT middle lobes. This is consistent with the chronic sequela of previously seen extensive pulmonary embolism from January 2022. Unchanged prominent pericardial recess inferior to the RIGHT inferior pulmonary vein dating back to 2011. Heart is normal in size. LEFT vertebral artery arises directly from the aorta. Mediastinum/Nodes: No axillary or mediastinal adenopathy. Visualized thyroid  is unremarkable. Lungs/Pleura: No pleural effusion or pneumothorax. Mild mosaic attenuation likely due to expiratory images versus air trapping. Upper Abdomen: No acute abnormality. Musculoskeletal: No chest wall abnormality. No acute or significant osseous findings. Review of the MIP images confirms the above findings. IMPRESSION: No acute pulmonary embolism. There is the chronic sequela of previously seen RIGHT-sided pulmonary embolism from January 2022. Electronically Signed   By: Valentino Saxon M.D.   On: 04/06/2021 18:55    Procedures Procedures    Medications Ordered in ED Medications  iohexol (OMNIPAQUE) 350 MG/ML injection 100 mL (100 mLs Intravenous Contrast Given 04/06/21 1838)    ED Course/ Medical Decision Making/ A&P                           Medical Decision Making Amount and/or Complexity of Data Reviewed Labs: ordered. Radiology: ordered.  Risk Prescription drug management.   51 yo F with a chief complaint of chest pain.  This is left-sided and pinpoint.  Has been going on since this morning.  Is atypical in nature and reproduced on exam.  She has a troponin that is negative no significant electrolyte abnormality no significant anemia no leukocytosis chest x-ray independently interpreted by me without focal infiltrate or  pneumothorax.  She is concerned that this may be a recurrent PE.  It sounds like she has missed some doses of her blood thinner at home.  We will obtain a CT scan. CT scan is negative for any acute pulmonary embolism.  We will discharge the patient home.  Treat as musculoskeletal.  PCP follow-up.  Of note the patient arrived here with her mother who is also a patient.  7:18 PM:  I have discussed the diagnosis/risks/treatment options with the patient.  Evaluation and diagnostic testing in the emergency department does not suggest an emergent condition requiring admission or immediate intervention beyond what has been performed at this time.  They will follow up with  PCP. We also discussed returning to the ED immediately if new or worsening sx occur. We discussed the sx which are most concerning (e.g., sudden worsening pain, fever, inability to tolerate by mouth) that necessitate immediate return. Medications administered to the patient during their visit and any new prescriptions provided to the patient are listed below.  Medications given during this visit Medications  iohexol (OMNIPAQUE) 350 MG/ML injection 100 mL (100 mLs Intravenous Contrast Given 04/06/21 1838)     The patient appears reasonably screen and/or stabilized for discharge and I doubt any other medical condition or other [facility] requiring further screening, evaluation, or treatment in the ED at this time prior to discharge.          Final Clinical Impression(s) / ED Diagnoses Final diagnoses:  Nonspecific chest pain    Rx / DC Orders ED Discharge Orders          Ordered    diclofenac Sodium (VOLTAREN) 1 % GEL  4 times daily        04/06/21 1906              Orie Cuttino, DO 04/06/21 1918

## 2021-04-06 NOTE — ED Triage Notes (Signed)
Pt arrives pov with c/o CP starting this am. Endorses pain under left breast radiating to back. Denies n/v or shob. Pt endorses eloquis d/t hx of PE ?

## 2022-02-09 ENCOUNTER — Other Ambulatory Visit: Payer: Self-pay

## 2022-02-09 ENCOUNTER — Encounter (HOSPITAL_BASED_OUTPATIENT_CLINIC_OR_DEPARTMENT_OTHER): Payer: Self-pay | Admitting: Emergency Medicine

## 2022-02-09 ENCOUNTER — Emergency Department (HOSPITAL_BASED_OUTPATIENT_CLINIC_OR_DEPARTMENT_OTHER): Payer: Medicaid Other

## 2022-02-09 ENCOUNTER — Emergency Department (HOSPITAL_BASED_OUTPATIENT_CLINIC_OR_DEPARTMENT_OTHER)
Admission: EM | Admit: 2022-02-09 | Discharge: 2022-02-09 | Disposition: A | Discharge status: Home / Self Care | Payer: Medicaid Other | Attending: Emergency Medicine | Admitting: Emergency Medicine

## 2022-02-09 DIAGNOSIS — J111 Influenza due to unidentified influenza virus with other respiratory manifestations: Secondary | ICD-10-CM

## 2022-02-09 DIAGNOSIS — Z7901 Long term (current) use of anticoagulants: Secondary | ICD-10-CM | POA: Insufficient documentation

## 2022-02-09 DIAGNOSIS — Z7982 Long term (current) use of aspirin: Secondary | ICD-10-CM | POA: Insufficient documentation

## 2022-02-09 DIAGNOSIS — Z1152 Encounter for screening for COVID-19: Secondary | ICD-10-CM | POA: Insufficient documentation

## 2022-02-09 DIAGNOSIS — J101 Influenza due to other identified influenza virus with other respiratory manifestations: Secondary | ICD-10-CM | POA: Insufficient documentation

## 2022-02-09 DIAGNOSIS — R059 Cough, unspecified: Secondary | ICD-10-CM | POA: Diagnosis present

## 2022-02-09 LAB — RESP PANEL BY RT-PCR (RSV, FLU A&B, COVID)  RVPGX2
Influenza A by PCR: NEGATIVE
Influenza B by PCR: POSITIVE — AB
Resp Syncytial Virus by PCR: NEGATIVE
SARS Coronavirus 2 by RT PCR: NEGATIVE

## 2022-02-09 LAB — GROUP A STREP BY PCR: Group A Strep by PCR: NOT DETECTED

## 2022-02-09 NOTE — ED Triage Notes (Signed)
Pt arrives pov, steady gait c/o cough, fever, sore throat and congestion with fatigue x 5 days. Pt also endorses LT side CP

## 2022-02-09 NOTE — ED Provider Notes (Signed)
[place] EMERGENCY DEPARTMENT Provider Note   CSN: [id number] Arrival date & time: 02/09/22  1322     History  Chief Complaint  Patient presents with   Cough    Cassidy Jarvis is a 52 y.o. female here with 4 days of cough, congestion, sore throat.  Her young son is sick with the same symptoms in the house.  HPI     Home Medications Prior to Admission medications   Medication Sig Start Date End Date Taking? Authorizing Provider  aspirin EC 81 MG tablet Take 81 mg by mouth daily.    [provider]  Cetirizine HCl (ZYRTEC ALLERGY) 10 MG CAPS Take 1 capsule (10 mg total) by mouth daily. 11/27/17   Tacy Learn, PA-C  chlorpheniramine-HYDROcodone (TUSSIONEX PENNKINETIC ER) 10-8 MG/5ML SUER Take 5 mLs by mouth every 12 (twelve) hours as needed for cough. 11/28/20   Molpus, John, MD  diclofenac Sodium (VOLTAREN) 1 % GEL Apply 4 g topically 4 (four) times daily. 04/06/21   Deno Etienne, DO  fluticasone (FLONASE) 50 MCG/ACT nasal spray Place 1 spray into both nostrils daily. 11/27/17   Suella Broad A, PA-C  GLIPIZIDE PO Take by mouth.    [provider]  ondansetron (ZOFRAN ODT) 8 MG disintegrating tablet Take 1 tablet (8 mg total) by mouth every 8 (eight) hours as needed for vomiting or nausea. 11/28/20   Molpus, John, MD  oseltamivir (TAMIFLU) 75 MG capsule Take 1 capsule (75 mg total) by mouth every 12 (twelve) hours. 11/28/20   Molpus, John, MD  Pioglitazone HCl (ACTOS PO) Take by mouth.    [provider]  pravastatin (PRAVACHOL) 40 MG tablet Take 40 mg by mouth at bedtime.    [provider]  warfarin (COUMADIN) 7.5 MG tablet Take 7.5-11.25 mg by mouth every evening. Take 1 1/2 tablets (11.25 mg) by mouth on Monday, Wednesday, Friday, take 1 tablet (7.5 mg) on Sunday, Tuesday, Thursday, Saturday.    [provider]      Allergies    Metformin and related and Other    Review of Systems   Review of Systems  Physical  Exam Updated Vital Signs BP (!) 148/85   Pulse 80   Temp 97.8 F (36.6 C) (Oral)   Resp 16   Wt 97.5 kg   SpO2 97%   BMI 34.70 kg/m  Physical Exam Constitutional:      General: She is not in acute distress. HENT:     Head: Normocephalic and atraumatic.  Eyes:     Conjunctiva/sclera: Conjunctivae normal.     Pupils: Pupils are equal, round, and reactive to light.  Cardiovascular:     Rate and Rhythm: Normal rate and regular rhythm.  Pulmonary:     Effort: Pulmonary effort is normal. No respiratory distress.  Abdominal:     General: There is no distension.     Tenderness: There is no abdominal tenderness.  Skin:    General: Skin is warm and dry.  Neurological:     General: No focal deficit present.     Mental Status: She is alert. Mental status is at baseline.  Psychiatric:        Mood and Affect: Mood normal.        Behavior: Behavior normal.     ED Results / Procedures / Treatments   Labs (all labs ordered are listed, but only abnormal results are displayed) Labs Reviewed  RESP PANEL BY RT-PCR (RSV, FLU A&B, COVID)  RVPGX2 -  Abnormal; Notable for the following components:      Result Value   Influenza B by PCR POSITIVE (*)    All other components within normal limits  GROUP A STREP BY PCR    EKG EKG Interpretation  Date/Time:  Sunday February 09 2022 14:08:39 EST Ventricular Rate:  78 PR Interval:  146 QRS Duration: 87 QT Interval:  374 QTC Calculation: 426 R Axis:   6 Text Interpretation: Sinus rhythm Low voltage, precordial leads Confirmed by Ernie Avena ([id number]) on 02/09/2022 3:09:51 PM  Radiology DG Chest 2 View  Result Date: 02/09/2022 CLINICAL DATA:  Cough.  Chest pain. EXAM: CHEST - 2 VIEW COMPARISON:  April 06, 2021 FINDINGS: The heart size and mediastinal contours are within normal limits. Both lungs are clear. The visualized skeletal structures are unremarkable. IMPRESSION: No active cardiopulmonary disease. Electronically Signed   By: Gerome Sam III M.D.   On: 02/09/2022 14:27    Procedures Procedures    Medications Ordered in ED Medications - No data to display  ED Course/ Medical Decision Making/ A&P                           Medical Decision Making Amount and/or Complexity of Data Reviewed Radiology: ordered.   Patient is here with suspected viral syndrome.  Her influenza test is positive.  Sick contact in the house.  I strongly suspect is related to influenza.  Lower suspicion for acute bacterial infection, and there is no indication for antibiotics at this time.  She does not appear clinically dehydrated.  She is in no respiratory distress.  We discussed quarantine precautions and can follow-up with her PCP.  I personally reviewed the x-rays and interpreted them, no focal infiltrate.  Strep test was negative.  I have a low suspicion for ACS or PE.  I suspect she is having some pleuritic chest pain related to her coughing from her influenza.        Final Clinical Impression(s) / ED Diagnoses Final diagnoses:  Influenza    Rx / DC Orders ED Discharge Orders     None         Gunnar Hereford, Kermit Balo, MD 02/09/22 1758

## 2023-02-19 ENCOUNTER — Other Ambulatory Visit: Payer: Self-pay

## 2023-02-19 ENCOUNTER — Emergency Department (HOSPITAL_BASED_OUTPATIENT_CLINIC_OR_DEPARTMENT_OTHER)
Admission: EM | Admit: 2023-02-19 | Discharge: 2023-02-19 | Disposition: A | Discharge status: Home / Self Care | Payer: Medicaid Other | Attending: Emergency Medicine | Admitting: Emergency Medicine

## 2023-02-19 ENCOUNTER — Emergency Department (HOSPITAL_BASED_OUTPATIENT_CLINIC_OR_DEPARTMENT_OTHER): Payer: Medicaid Other

## 2023-02-19 ENCOUNTER — Encounter (HOSPITAL_BASED_OUTPATIENT_CLINIC_OR_DEPARTMENT_OTHER): Payer: Self-pay

## 2023-02-19 DIAGNOSIS — M79601 Pain in right arm: Secondary | ICD-10-CM | POA: Insufficient documentation

## 2023-02-19 DIAGNOSIS — E119 Type 2 diabetes mellitus without complications: Secondary | ICD-10-CM | POA: Diagnosis not present

## 2023-02-19 DIAGNOSIS — M5412 Radiculopathy, cervical region: Secondary | ICD-10-CM | POA: Diagnosis not present

## 2023-02-19 DIAGNOSIS — R0789 Other chest pain: Secondary | ICD-10-CM | POA: Diagnosis not present

## 2023-02-19 DIAGNOSIS — R202 Paresthesia of skin: Secondary | ICD-10-CM | POA: Diagnosis present

## 2023-02-19 LAB — CBC
HCT: 45.7 % (ref 36.0–46.0)
Hemoglobin: 15.2 g/dL — ABNORMAL HIGH (ref 12.0–15.0)
MCH: 31.7 pg (ref 26.0–34.0)
MCHC: 33.3 g/dL (ref 30.0–36.0)
MCV: 95.2 fL (ref 80.0–100.0)
Platelets: 290 10*3/uL (ref 150–400)
RBC: 4.8 MIL/uL (ref 3.87–5.11)
RDW: 12.9 % (ref 11.5–15.5)
WBC: 7.3 10*3/uL (ref 4.0–10.5)
nRBC: 0 % (ref 0.0–0.2)

## 2023-02-19 LAB — BASIC METABOLIC PANEL
Anion gap: 9 (ref 5–15)
BUN: 10 mg/dL (ref 6–20)
CO2: 26 mmol/L (ref 22–32)
Calcium: 8.9 mg/dL (ref 8.9–10.3)
Chloride: 99 mmol/L (ref 98–111)
Creatinine, Ser: 0.75 mg/dL (ref 0.44–1.00)
GFR, Estimated: >60 mL/min (ref >60)
Glucose, Bld: 242 mg/dL — ABNORMAL HIGH (ref 70–99)
Potassium: 4.6 mmol/L (ref 3.5–5.1)
Sodium: 134 mmol/L — ABNORMAL LOW (ref 135–145)

## 2023-02-19 LAB — D-DIMER, QUANTITATIVE: D-Dimer, Quant: <0.27 ug{FEU}/mL (ref 0.00–0.50)

## 2023-02-19 LAB — PROTIME-INR
INR: 0.9 (ref 0.8–1.2)
Prothrombin Time: 12.8 s (ref 11.4–15.2)

## 2023-02-19 LAB — TROPONIN I (HIGH SENSITIVITY): Troponin I (High Sensitivity): 3 ng/L (ref <18)

## 2023-02-19 LAB — PREGNANCY, URINE: Preg Test, Ur: NEGATIVE

## 2023-02-19 MED ORDER — METHOCARBAMOL 500 MG PO TABS
1000.0000 mg | ORAL_TABLET | Freq: Once | ORAL | Status: AC
  Administered 2023-02-19: 1000 mg via ORAL
  Filled 2023-02-19: qty 2

## 2023-02-19 MED ORDER — DEXAMETHASONE SODIUM PHOSPHATE 10 MG/ML IJ SOLN
10.0000 mg | Freq: Once | INTRAMUSCULAR | Status: AC
  Administered 2023-02-19: 10 mg via INTRAMUSCULAR
  Filled 2023-02-19: qty 1

## 2023-02-19 MED ORDER — DICLOFENAC SODIUM 1 % EX GEL: 2.0000 g | Freq: Four times a day (QID) | CUTANEOUS | 0 refills | Status: AC | PRN

## 2023-02-19 MED ORDER — METHOCARBAMOL 500 MG PO TABS: 500.0000 mg | ORAL_TABLET | Freq: Three times a day (TID) | ORAL | 0 refills | Status: DC | PRN

## 2023-02-19 MED ORDER — METHOCARBAMOL 1000 MG/10ML IJ SOLN
1000.0000 mg | Freq: Once | INTRAMUSCULAR | Status: DC
  Filled 2023-02-19: qty 10

## 2023-02-19 NOTE — ED Provider Notes (Signed)
Emergency Department Provider Note   I have reviewed the triage vital signs and the nursing notes.   HISTORY  Chief Complaint Chest Pain   HPI Cassidy Jarvis is a 53 y.o. female past history reviewed below including prior PE, compliant with anticoagulation, presents emergency department with right arm pain and tingling.  Symptoms radiate from the neck.  No injury.  She has had several days of constant pain.  No numbness or weakness.  No pain into the mid or lower back.  No leg discomfort.  She does have some mild right-sided chest discomfort also radiating from the arm with nausea.  Notes this does not feel like her prior PE. No missed doses of anticoagulant.    Past Medical History:  Diagnosis Date   Diabetes mellitus    DVT (deep venous thrombosis) (HCC)    History of blood clots     Review of Systems  Constitutional: No fever/chills Cardiovascular: Positive chest pain. Respiratory: Denies shortness of breath. Gastrointestinal: No abdominal pain. Mild nausea, no vomiting.   Musculoskeletal: Negative for back pain. Positive right arm pain.  Skin: Negative for rash. Neurological: Negative for headaches, focal weakness or numbness. ____________________________________________   PHYSICAL EXAM:  VITAL SIGNS: ED Triage Vitals  Encounter Vitals Group     BP 02/19/23 1953 129/72     Pulse Rate 02/19/23 1953 66     Resp 02/19/23 1953 16     Temp 02/19/23 1953 98.2 F (36.8 C)     Temp src --      SpO2 02/19/23 1953 100 %     Weight 02/19/23 1953 260 lb (117.9 kg)     Height 02/19/23 1953 5\' 6"  (1.676 m)   Constitutional: Alert and oriented. Well appearing and in no acute distress. Eyes: Conjunctivae are normal. Head: Atraumatic. Nose: No congestion/rhinnorhea. Mouth/Throat: Mucous membranes are moist.   Neck: No stridor.  Cardiovascular: Normal rate, regular rhythm. Good peripheral circulation. Grossly normal heart sounds.   Respiratory: Normal respiratory effort.   No retractions. Lungs CTAB. Gastrointestinal: Soft and nontender. No distention.  Musculoskeletal: No lower extremity tenderness nor edema. No gross deformities of extremities. Neurologic:  Normal speech and language. No gross focal neurologic deficits are appreciated.  Skin:  Skin is warm, dry and intact. No rash noted.  ____________________________________________   LABS (all labs ordered are listed, but only abnormal results are displayed)  Labs Reviewed  BASIC METABOLIC PANEL - Abnormal; Notable for the following components:      Result Value   Sodium 134 (*)    Glucose, Bld 242 (*)    All other components within normal limits  CBC - Abnormal; Notable for the following components:   Hemoglobin 15.2 (*)    All other components within normal limits  PREGNANCY, URINE  D-DIMER, QUANTITATIVE  PROTIME-INR  TROPONIN I (HIGH SENSITIVITY)  TROPONIN I (HIGH SENSITIVITY)   ____________________________________________  EKG   EKG Interpretation Date/Time:  Thursday February 19 2023 19:56:55 EST Ventricular Rate:  68 PR Interval:  168 QRS Duration:  75 QT Interval:  374 QTC Calculation: 398 R Axis:   -3  Text Interpretation: Sinus rhythm Probable left atrial enlargement Low voltage, precordial leads No significant change since prior 1/24 Confirmed by Meridee Score [phone]) on 02/19/2023 8:01:50 PM        ____________________________________________  RADIOLOGY  DG Chest 2 View Result Date: 02/19/2023 CLINICAL DATA:  Chest pain. EXAM: CHEST - 2 VIEW COMPARISON:  February 09, 2022 FINDINGS: The heart size and  mediastinal contours are within normal limits. Both lungs are clear. The visualized skeletal structures are unremarkable. IMPRESSION: No active cardiopulmonary disease. Electronically Signed   By: Aram Candela M.D.   On: 02/19/2023 21:19    ____________________________________________   PROCEDURES  Procedure(s) performed:   Procedures  None   ____________________________________________   INITIAL IMPRESSION / ASSESSMENT AND PLAN / ED COURSE  Pertinent labs & imaging results that were available during my care of the patient were reviewed by me and considered in my medical decision making (see chart for details).   This patient is Presenting for Evaluation of arm pain, which does require a range of treatment options, and is a complaint that involves a high risk of morbidity and mortality.  The Differential Diagnoses includes but is not exclusive to acute coronary syndrome, aortic dissection, pulmonary embolism, cardiac tamponade, community-acquired pneumonia, pericarditis, musculoskeletal chest wall pain, etc.   Critical Interventions-    Medications  dexamethasone (DECADRON) injection 10 mg (has no administration in time range)  methocarbamol (ROBAXIN) injection 1,000 mg (has no administration in time range)    Reassessment after intervention: pain improved.    Clinical Laboratory Tests Ordered, included troponin negative.  Considered trending the patient has had constant pain for at least 2 days.  D-dimer negative.  CBC without leukocytosis.  No acute kidney injury.  Radiologic Tests Ordered, included CXR. I independently interpreted the images and agree with radiology interpretation.   Cardiac Monitor Tracing which shows NSR.    Social Determinants of Health Risk patient with a smoking history.   Medical Decision Making: Summary:  Patient presents emergency department with mainly right arm pain.  Good pulses and sensation in the extremity.  Symptoms seem most consistent with cervical radiculopathy rather than ACS or PE which were also considered.  She remains compliant with her anticoagulation for PE and D-dimer, ordered from triage, was negative. No plan for CTA.  No clear indication for emergent MRI. Plan for symptom mgmt and PCP follow up plan.   Patient's presentation is most consistent with acute presentation with  potential threat to life or bodily function.   Disposition: discharge  ____________________________________________  FINAL CLINICAL IMPRESSION(S) / ED DIAGNOSES  Final diagnoses:  Cervical radiculopathy  Atypical chest pain     NEW OUTPATIENT MEDICATIONS STARTED DURING THIS VISIT:  New Prescriptions   DICLOFENAC SODIUM (VOLTAREN) 1 % GEL    Apply 2 g topically 4 (four) times daily as needed.   METHOCARBAMOL (ROBAXIN) 500 MG TABLET    Take 1 tablet (500 mg total) by mouth every 8 (eight) hours as needed for muscle spasms.    Note:  This document was prepared using Dragon voice recognition software and may include unintentional dictation errors.  Alona Bene, MD, [facility] Emergency Medicine    Geral Tuch, Arlyss Repress, MD 02/20/23 [phone]

## 2023-02-19 NOTE — ED Triage Notes (Addendum)
Pt c/o rt. Sided CP that radiates to neck, shoulder and back that started yesterday -nausea -SHOB Hx of Pulmonary emboli Pt is on Eliquis

## 2023-02-19 NOTE — Discharge Instructions (Signed)
You have been seen in the Emergency Department (ED) today for chest pain.  As we have discussed today's test results are normal, but you may require further testing.  I suspect you may have a pinched nerve in your neck causing some of your pain.   Please follow up with the recommended doctor as instructed above in these documents regarding today's emergent visit and your recent symptoms to discuss further management.  Continue to take your regular medications.   Return to the Emergency Department (ED) if you experience any further chest pain/pressure/tightness, difficulty breathing, or sudden sweating, or other symptoms that concern you.

## 2023-03-05 ENCOUNTER — Encounter: Payer: Medicaid Other | Admitting: Family Medicine

## 2023-03-27 ENCOUNTER — Encounter: Payer: Medicaid Other | Admitting: Obstetrics and Gynecology

## 2023-04-09 ENCOUNTER — Emergency Department (HOSPITAL_BASED_OUTPATIENT_CLINIC_OR_DEPARTMENT_OTHER)
Admission: EM | Admit: 2023-04-09 | Discharge: 2023-04-09 | Disposition: A | Discharge status: Home / Self Care | Attending: Emergency Medicine | Admitting: Emergency Medicine

## 2023-04-09 ENCOUNTER — Encounter (HOSPITAL_BASED_OUTPATIENT_CLINIC_OR_DEPARTMENT_OTHER): Payer: Self-pay

## 2023-04-09 ENCOUNTER — Other Ambulatory Visit: Payer: Self-pay

## 2023-04-09 DIAGNOSIS — Z7982 Long term (current) use of aspirin: Secondary | ICD-10-CM | POA: Diagnosis not present

## 2023-04-09 DIAGNOSIS — Z7901 Long term (current) use of anticoagulants: Secondary | ICD-10-CM | POA: Insufficient documentation

## 2023-04-09 DIAGNOSIS — Z7984 Long term (current) use of oral hypoglycemic drugs: Secondary | ICD-10-CM | POA: Diagnosis not present

## 2023-04-09 DIAGNOSIS — J069 Acute upper respiratory infection, unspecified: Secondary | ICD-10-CM | POA: Diagnosis not present

## 2023-04-09 DIAGNOSIS — E119 Type 2 diabetes mellitus without complications: Secondary | ICD-10-CM | POA: Insufficient documentation

## 2023-04-09 DIAGNOSIS — F17210 Nicotine dependence, cigarettes, uncomplicated: Secondary | ICD-10-CM | POA: Diagnosis not present

## 2023-04-09 DIAGNOSIS — R0981 Nasal congestion: Secondary | ICD-10-CM | POA: Diagnosis present

## 2023-04-09 LAB — RESP PANEL BY RT-PCR (RSV, FLU A&B, COVID)  RVPGX2
Influenza A by PCR: NEGATIVE
Influenza B by PCR: NEGATIVE
Resp Syncytial Virus by PCR: NEGATIVE
SARS Coronavirus 2 by RT PCR: NEGATIVE

## 2023-04-09 MED ORDER — CETIRIZINE-PSEUDOEPHEDRINE ER 5-120 MG PO TB12: 1.0000 | ORAL_TABLET | Freq: Every day | ORAL | 0 refills | Status: AC | PRN

## 2023-04-09 MED ORDER — BENZONATATE 100 MG PO CAPS: 100.0000 mg | ORAL_CAPSULE | Freq: Three times a day (TID) | ORAL | 0 refills | Status: AC | PRN

## 2023-04-09 MED ORDER — FLUTICASONE PROPIONATE 50 MCG/ACT NA SUSP: 1.0000 | Freq: Every day | NASAL | 0 refills | Status: AC

## 2023-04-09 NOTE — ED Triage Notes (Signed)
 Patient arrives POV with complaints of cough, sinus pain, and chest congestion 2 days. Her son currently has RSV.

## 2023-04-09 NOTE — ED Provider Notes (Signed)
 [place] [facility] Provider Note   CSN: [id number] Arrival date & time: 04/09/23  1442     History  Chief Complaint  Patient presents with   Nasal Congestion   Cough    BILLIEJEAN SCHIMEK is a 53 y.o. female.   Cough   53 year old female presents emergency department with cough, congestion, chills.  She has been ill with symptoms for the past 3 days.  Known illness exposure through her son has been ill for the past several days.  Patient is trying over-the-counter medications with some improvement of symptoms.  Denies any abdominal pain, chest pain, shortness of breath, nausea vomit, urinary symptoms, change in bowel habits.  Past medical history significant for diabetes mellitus, DVT on Eliquis  Home Medications Prior to Admission medications   Medication Sig Start Date End Date Taking? Authorizing Provider  benzonatate (TESSALON) 100 MG capsule Take 1 capsule (100 mg total) by mouth 3 (three) times daily as needed. 04/09/23  Yes Sherian Maroon A, PA  cetirizine-pseudoephedrine (ZYRTEC-D) 5-120 MG tablet Take 1 tablet by mouth daily as needed for allergies or rhinitis. 04/09/23  Yes Sherian Maroon A, PA  aspirin EC 81 MG tablet Take 81 mg by mouth daily.    [provider]  diclofenac Sodium (VOLTAREN) 1 % GEL Apply 2 g topically 4 (four) times daily as needed. 02/19/23   Long, Arlyss Repress, MD  fluticasone (FLONASE) 50 MCG/ACT nasal spray Place 1 spray into both nostrils daily. 04/09/23   Sherian Maroon A, PA  GLIPIZIDE PO Take by mouth.    [provider]  methocarbamol (ROBAXIN) 500 MG tablet Take 1 tablet (500 mg total) by mouth every 8 (eight) hours as needed for muscle spasms. 02/19/23   Long, Arlyss Repress, MD  ondansetron (ZOFRAN ODT) 8 MG disintegrating tablet Take 1 tablet (8 mg total) by mouth every 8 (eight) hours as needed for vomiting or nausea. 11/28/20   Molpus, John, MD  oseltamivir (TAMIFLU) 75 MG capsule Take 1 capsule (75  mg total) by mouth every 12 (twelve) hours. 11/28/20   Molpus, John, MD  Pioglitazone HCl (ACTOS PO) Take by mouth.    [provider]  pravastatin (PRAVACHOL) 40 MG tablet Take 40 mg by mouth at bedtime.    [provider]  warfarin (COUMADIN) 7.5 MG tablet Take 7.5-11.25 mg by mouth every evening. Take 1 1/2 tablets (11.25 mg) by mouth on Monday, Wednesday, Friday, take 1 tablet (7.5 mg) on Sunday, Tuesday, Thursday, Saturday.    [provider]      Allergies    Metformin and related and Other    Review of Systems   Review of Systems  Respiratory:  Positive for cough.   All other systems reviewed and are negative.   Physical Exam Updated Vital Signs BP 125/82 (BP Location: Left Arm)   Pulse 80   Temp 98.1 F (36.7 C) (Oral)   Resp 20   Ht 5\' 6"  (1.676 m)   Wt 117.9 kg   SpO2 98%   BMI 41.97 kg/m  Physical Exam Vitals and nursing note reviewed.  Constitutional:      General: She is not in acute distress.    Appearance: She is well-developed.  HENT:     Head: Normocephalic and atraumatic.     Nose: Congestion present.     Mouth/Throat:     Comments: Mild posterior pharyngeal erythema.  Uvula midline rise metrical phonation.  No sublingual or submandibular swelling.  No trismus.  Tonsils 1+ bilaterally without exudate. Eyes:     Conjunctiva/sclera: Conjunctivae normal.  Cardiovascular:     Rate and Rhythm: Normal rate and regular rhythm.     Heart sounds: No murmur heard. Pulmonary:     Effort: Pulmonary effort is normal. No respiratory distress.     Breath sounds: Normal breath sounds. No wheezing, rhonchi or rales.  Abdominal:     Palpations: Abdomen is soft.     Tenderness: There is no abdominal tenderness.  Musculoskeletal:        General: No swelling.     Cervical back: Neck supple.  Skin:    General: Skin is warm and dry.     Capillary Refill: Capillary refill takes less than 2 seconds.  Neurological:     Mental Status: She is  alert.  Psychiatric:        Mood and Affect: Mood normal.     ED Results / Procedures / Treatments   Labs (all labs ordered are listed, but only abnormal results are displayed) Labs Reviewed  RESP PANEL BY RT-PCR (RSV, FLU A&B, COVID)  [id number]    EKG None  Radiology No results found.  Procedures Procedures    Medications Ordered in ED Medications - No data to display  ED Course/ Medical Decision Making/ A&P                                 Medical Decision Making Risk OTC drugs. Prescription drug management.   This patient presents to the ED for concern of cough, congestion, this involves an extensive number of treatment options, and is a complaint that carries with it a high risk of complications and morbidity.  The differential diagnosis includes COVID, flu, RSV, pneumonia, other   Co morbidities that complicate the patient evaluation  See HPI   Additional history obtained:  Additional history obtained from EMR External records from outside source obtained and reviewed including hospital records   Lab Tests:  I Ordered, and personally interpreted labs.  The pertinent results include: Viral testing negative   Imaging Studies ordered:  N/a   Cardiac Monitoring: / EKG:  The patient was maintained on a cardiac monitor.  I personally viewed and interpreted the cardiac monitored which showed an underlying rhythm of: sinus rhythm   Consultations Obtained:  N/a   Problem List / ED Course / Critical interventions / Medication management  Viral URI with cough Reevaluation of the patient showed that the patient stayed the same I have reviewed the patients home medicines and have made adjustments as needed   Social Determinants of Health:  Cigarette use.  Denies illicit drug use.   Test / Admission - Considered:  Viral URI with cough Vitals signs within normal range and stable throughout visit. Laboratory studies significant for: See  above 53 year old female presents emergency department with cough, congestion, chills.  She has been ill with symptoms for the past 3 days.  Known illness exposure through her son has been ill for the past several days.  Patient is trying over-the-counter medications with some improvement of symptoms.  Denies any abdominal pain, chest pain, shortness of breath, nausea vomit, urinary symptoms, change in bowel habits. On exam, lungs clear to auscultation bilaterally; low suspicion for pneumonia.  No evidence clinically of meningismus.  Suspect the patient's symptoms likely secondary to viral process especially given known exposure to son who is ill with RSV.Marland Kitchen  Patient  has MyChart at home and elected to follow for results due to prolonged lab wait time.  Will recommend symptomatic therapy as described in AVS and close follow-up with PCP in the outpatient setting for reassessment.  Treatment plan discussed length with patient and she acknowledged understanding was agreeable to said plan.  Patient overall well-appearing, afebrile in no acute distress. Worrisome signs and symptoms were discussed with the patient, and the patient acknowledged understanding to return to the ED if noticed. Patient was stable upon discharge.          Final Clinical Impression(s) / ED Diagnoses Final diagnoses:  Viral URI with cough    Rx / DC Orders      Peter Garter, PA 04/09/23 1708    Virgina Norfolk, DO 04/09/23 2207

## 2023-04-09 NOTE — Discharge Instructions (Signed)
 As discussed, follow MyChart for the results of your viral testing.  Will send you home with cough medicine to use as needed as well as allergy medicine with decongestion along with a nasal spray to help with your symptoms.  Continue take Tylenol/Motrin for any body aches, fevers, chills.  Recommend follow-up with your primary care for reassessment.  Please not hesitate to return to emergency department if the worrisome signs and symptoms we discussed become apparent.

## 2023-04-09 NOTE — ED Notes (Signed)
 Pt. Has been assessed By EDP and will be discharged accordingly.

## 2023-07-07 ENCOUNTER — Emergency Department (HOSPITAL_BASED_OUTPATIENT_CLINIC_OR_DEPARTMENT_OTHER)

## 2023-07-07 ENCOUNTER — Other Ambulatory Visit: Payer: Self-pay

## 2023-07-07 ENCOUNTER — Emergency Department (HOSPITAL_BASED_OUTPATIENT_CLINIC_OR_DEPARTMENT_OTHER)
Admission: EM | Admit: 2023-07-07 | Discharge: 2023-07-07 | Disposition: A | Discharge status: Home / Self Care | Attending: Emergency Medicine | Admitting: Emergency Medicine

## 2023-07-07 ENCOUNTER — Encounter (HOSPITAL_BASED_OUTPATIENT_CLINIC_OR_DEPARTMENT_OTHER): Payer: Self-pay | Admitting: Urology

## 2023-07-07 DIAGNOSIS — R079 Chest pain, unspecified: Secondary | ICD-10-CM | POA: Diagnosis present

## 2023-07-07 DIAGNOSIS — M62830 Muscle spasm of back: Secondary | ICD-10-CM | POA: Diagnosis not present

## 2023-07-07 DIAGNOSIS — Z7901 Long term (current) use of anticoagulants: Secondary | ICD-10-CM | POA: Diagnosis not present

## 2023-07-07 DIAGNOSIS — R0789 Other chest pain: Secondary | ICD-10-CM | POA: Insufficient documentation

## 2023-07-07 DIAGNOSIS — Z7982 Long term (current) use of aspirin: Secondary | ICD-10-CM | POA: Insufficient documentation

## 2023-07-07 LAB — BASIC METABOLIC PANEL WITH GFR
Anion gap: 11 (ref 5–15)
BUN: 9 mg/dL (ref 6–20)
CO2: 23 mmol/L (ref 22–32)
Calcium: 8.9 mg/dL (ref 8.9–10.3)
Chloride: 104 mmol/L (ref 98–111)
Creatinine, Ser: 0.65 mg/dL (ref 0.44–1.00)
GFR, Estimated: >60 mL/min (ref >60)
Glucose, Bld: 184 mg/dL — ABNORMAL HIGH (ref 70–99)
Potassium: 4.2 mmol/L (ref 3.5–5.1)
Sodium: 137 mmol/L (ref 135–145)

## 2023-07-07 LAB — CBC
HCT: 41.3 % (ref 36.0–46.0)
Hemoglobin: 13.8 g/dL (ref 12.0–15.0)
MCH: 31.7 pg (ref 26.0–34.0)
MCHC: 33.4 g/dL (ref 30.0–36.0)
MCV: 94.7 fL (ref 80.0–100.0)
Platelets: 293 10*3/uL (ref 150–400)
RBC: 4.36 MIL/uL (ref 3.87–5.11)
RDW: 12.8 % (ref 11.5–15.5)
WBC: 7.2 10*3/uL (ref 4.0–10.5)
nRBC: 0 % (ref 0.0–0.2)

## 2023-07-07 LAB — TROPONIN T, HIGH SENSITIVITY
Troponin T High Sensitivity: <15 ng/L (ref <19)
Troponin T High Sensitivity: <15 ng/L (ref <19)

## 2023-07-07 MED ORDER — LIDOCAINE 5 % EX PTCH
1.0000 | MEDICATED_PATCH | CUTANEOUS | 0 refills | Status: AC
Start: 2023-07-07 — End: ?

## 2023-07-07 MED ORDER — FENTANYL CITRATE PF 50 MCG/ML IJ SOSY
25.0000 ug | PREFILLED_SYRINGE | Freq: Once | INTRAMUSCULAR | Status: AC
  Administered 2023-07-07: 25 ug via INTRAVENOUS
  Filled 2023-07-07: qty 1

## 2023-07-07 MED ORDER — KETOROLAC TROMETHAMINE 15 MG/ML IJ SOLN
15.0000 mg | Freq: Once | INTRAMUSCULAR | Status: AC
  Administered 2023-07-07: 15 mg via INTRAVENOUS
  Filled 2023-07-07: qty 1

## 2023-07-07 MED ORDER — ONDANSETRON HCL 4 MG/2ML IJ SOLN
4.0000 mg | Freq: Once | INTRAMUSCULAR | Status: AC
  Administered 2023-07-07: 4 mg via INTRAVENOUS
  Filled 2023-07-07: qty 2

## 2023-07-07 MED ORDER — LIDOCAINE 5 % EX PTCH
1.0000 | MEDICATED_PATCH | CUTANEOUS | Status: DC
  Administered 2023-07-07: 1 via TRANSDERMAL
  Filled 2023-07-07: qty 1

## 2023-07-07 MED ORDER — METHOCARBAMOL 500 MG PO TABS
500.0000 mg | ORAL_TABLET | Freq: Two times a day (BID) | ORAL | 0 refills | Status: AC
Start: 2023-07-07 — End: ?

## 2023-07-07 MED ORDER — DIAZEPAM 5 MG/ML IJ SOLN
2.5000 mg | Freq: Once | INTRAMUSCULAR | Status: AC
  Administered 2023-07-07: 2.5 mg via INTRAVENOUS
  Filled 2023-07-07: qty 2

## 2023-07-07 MED ORDER — IOHEXOL 350 MG/ML SOLN
100.0000 mL | Freq: Once | INTRAVENOUS | Status: AC | PRN
  Administered 2023-07-07: 100 mL via INTRAVENOUS

## 2023-07-07 NOTE — ED Triage Notes (Signed)
 Pt states right side chest pain x 1 week radiating down right arm  Denies SOB or N/V  States has worsened today  States noticed sharp pain in leg yesterday and has h/o blood clots States is on eliquis and just came back from trip

## 2023-07-07 NOTE — Discharge Instructions (Signed)
 Your x-ray, CT scan did not show any concerning findings.  Follow-up with your primary care doctor.  You likely have a muscle spasm.  You had improvement with medicines in the emergency department.  Muscle relaxer sent into the pharmacy.  This is called Robaxin .  This will make you drowsy so do not drive or do anything dangerous after taking this.  Lidocaine  patch also sent in.  Return for any emergent symptoms.

## 2023-07-07 NOTE — ED Provider Notes (Signed)
 [place] [facility] Provider Note   CSN: [id number] Arrival date & time: 07/07/23  1504     History  Chief Complaint  Patient presents with   Chest Pain    CYNTHYA YAM is a 53 y.o. female.  53 year old female with past medical history significant for DVT, PE presents today for sharp right-sided chest pain that has been ongoing for about a week but significantly worse today.  She is on Eliquis and is compliant.  Denies any injury or heavy lifting.  The history is provided by the patient. No language interpreter was used.       Home Medications Prior to Admission medications   Medication Sig Start Date End Date Taking? Authorizing Provider  aspirin  EC 81 MG tablet Take 81 mg by mouth daily.    [provider]  benzonatate  (TESSALON ) 100 MG capsule Take 1 capsule (100 mg total) by mouth 3 (three) times daily as needed. 04/09/23   [place] Butter, PA  cetirizine -pseudoephedrine  (ZYRTEC -D) 5-120 MG tablet Take 1 tablet by mouth daily as needed for allergies or rhinitis. 04/09/23   [place] Butter, PA  diclofenac  Sodium (VOLTAREN ) 1 % GEL Apply 2 g topically 4 (four) times daily as needed. 02/19/23   Long, Joshua G, MD  fluticasone  (FLONASE ) 50 MCG/ACT nasal spray Place 1 spray into both nostrils daily. 04/09/23   Neil Balls A, PA  GLIPIZIDE PO Take by mouth.    [provider]  methocarbamol  (ROBAXIN ) 500 MG tablet Take 1 tablet (500 mg total) by mouth every 8 (eight) hours as needed for muscle spasms. 02/19/23   Long, Shereen Dike, MD  ondansetron  (ZOFRAN  ODT) 8 MG disintegrating tablet Take 1 tablet (8 mg total) by mouth every 8 (eight) hours as needed for vomiting or nausea. 11/28/20   Molpus, John, MD  oseltamivir  (TAMIFLU ) 75 MG capsule Take 1 capsule (75 mg total) by mouth every 12 (twelve) hours. 11/28/20   Molpus, John, MD  Pioglitazone HCl (ACTOS PO) Take by mouth.    [provider]  pravastatin  (PRAVACHOL ) 40 MG  tablet Take 40 mg by mouth at bedtime.    [provider]  warfarin (COUMADIN ) 7.5 MG tablet Take 7.5-11.25 mg by mouth every evening. Take 1 1/2 tablets (11.25 mg) by mouth on Monday, Wednesday, Friday, take 1 tablet (7.5 mg) on Sunday, Tuesday, Thursday, Saturday.    [provider]      Allergies    Metformin  and related and Other    Review of Systems   Review of Systems  Constitutional:  Negative for chills and fever.  Respiratory:  Negative for cough and shortness of breath.   Cardiovascular:  Positive for chest pain. Negative for palpitations and leg swelling.  Neurological:  Negative for light-headedness.  All other systems reviewed and are negative.   Physical Exam Updated Vital Signs BP (!) 154/82 (BP Location: Left Arm)   Pulse 65   Temp 97.8 F (36.6 C)   Resp (!) 22   Ht 5\' 6"  (1.676 m)   Wt 117.9 kg   LMP 03/30/2021   SpO2 100%   BMI 41.95 kg/m  Physical Exam Vitals and nursing note reviewed.  Constitutional:      General: She is not in acute distress.    Appearance: Normal appearance. She is not ill-appearing.  HENT:     Head: Normocephalic and atraumatic.     Nose: Nose normal.  Eyes:     Conjunctiva/sclera: Conjunctivae  normal.  Cardiovascular:     Rate and Rhythm: Normal rate and regular rhythm.  Pulmonary:     Effort: Pulmonary effort is normal. No respiratory distress.     Breath sounds: No wheezing.  Abdominal:     Palpations: Abdomen is soft.  Musculoskeletal:        General: No deformity. Normal range of motion.     Cervical back: Normal range of motion.  Skin:    Findings: No rash.  Neurological:     Mental Status: She is alert.     ED Results / Procedures / Treatments   Labs (all labs ordered are listed, but only abnormal results are displayed) Labs Reviewed  BASIC METABOLIC PANEL WITH GFR - Abnormal; Notable for the following components:      Result Value   Glucose, Bld 184 (*)    All other components within  normal limits  CBC  TROPONIN T, HIGH SENSITIVITY  TROPONIN T, HIGH SENSITIVITY    EKG None  Radiology DG Chest 2 View Result Date: 07/07/2023 CLINICAL DATA:  Chest pain EXAM: CHEST - 2 VIEW COMPARISON:  Chest x-ray of 162 1,025 FINDINGS: The heart size and mediastinal contours are within normal limits. Both lungs are clear. The visualized skeletal structures are unremarkable. IMPRESSION: No active cardiopulmonary disease. Electronically Signed   By: Tyron Gallon M.D.   On: 07/07/2023 17:01    Procedures Procedures    Medications Ordered in ED Medications  lidocaine  (LIDODERM ) 5 % 1 patch (1 patch Transdermal Patch Applied 07/07/23 1806)  diazepam (VALIUM) injection 2.5 mg (2.5 mg Intravenous Given 07/07/23 1805)  ketorolac  (TORADOL ) 15 MG/ML injection 15 mg (15 mg Intravenous Given 07/07/23 1802)  ondansetron  (ZOFRAN ) injection 4 mg (4 mg Intravenous Given 07/07/23 1757)  fentaNYL (SUBLIMAZE) injection 25 mcg (25 mcg Intravenous Given 07/07/23 1803)    ED Course/ Medical Decision Making/ A&P                                 Medical Decision Making Amount and/or Complexity of Data Reviewed Labs: ordered. Radiology: ordered.  Risk Prescription drug management.   Medical Decision Making / ED Course   This patient presents to the ED for concern of chest pain, this involves an extensive number of treatment options, and is a complaint that carries with it a high risk of complications and morbidity.  The differential diagnosis includes ACS, PE, pneumonia, muscle spasm, MSK etiology, GERD, dissection  MDM: 53 year old female presents today for concern of chest pain that is right-sided.  Has history of DVT and PE.  She is on anticoagulation.  No other anginal symptoms.  CBC unremarkable, BMP with glucose of 194 otherwise without acute concern.  Troponin negative x 2.  Chest x-ray without acute cardiopulmonary process.  EKG without acute ischemic change.  Low suspicion for ACS.  Will  order CT angio study to rule out PE given her history.  Suspicious of muscle spasm. Will provide multimodal pain control and reevaluate.  Still pending CT PE study.  Reevaluation she does report improvement in symptoms and has improved range of motion in the right upper extremity without pain. Will send Robaxin  and lidocaine  patch into the pharmacy.  If CT negative she is stable for discharge.  Signed out to oncoming provider.   Additional history obtained: -Additional history obtained from mom at bedside. -External records from outside source obtained and reviewed including: Chart review including previous notes,  labs, imaging, consultation notes   Lab Tests: -I ordered, reviewed, and interpreted labs.   The pertinent results include:   Labs Reviewed  BASIC METABOLIC PANEL WITH GFR - Abnormal; Notable for the following components:      Result Value   Glucose, Bld 184 (*)    All other components within normal limits  CBC  TROPONIN T, HIGH SENSITIVITY  TROPONIN T, HIGH SENSITIVITY      EKG  EKG Interpretation Date/Time:    Ventricular Rate:    PR Interval:    QRS Duration:    QT Interval:    QTC Calculation:   R Axis:      Text Interpretation:           Imaging Studies ordered: I ordered imaging studies including chest x-ray, CT angio chest PE study I independently visualized and interpreted imaging. I agree with the radiologist interpretation   Medicines ordered and prescription drug management: Meds ordered this encounter  Medications   diazepam (VALIUM) injection 2.5 mg   ketorolac  (TORADOL ) 15 MG/ML injection 15 mg   ondansetron  (ZOFRAN ) injection 4 mg   fentaNYL (SUBLIMAZE) injection 25 mcg   lidocaine  (LIDODERM ) 5 % 1 patch    -I have reviewed the patients home medicines and have made adjustments as needed   Reevaluation: After the interventions noted above, I reevaluated the patient and found that they have :improved  Co morbidities that  complicate the patient evaluation  Past Medical History:  Diagnosis Date   Diabetes mellitus    DVT (deep venous thrombosis) (HCC)    History of blood clots       Dispostion: Signout to oncoming provider at the end of my shift.   Final Clinical Impression(s) / ED Diagnoses Final diagnoses:  Muscle spasm of back  Atypical chest pain    Rx / DC Orders ED Discharge Orders          Ordered    methocarbamol  (ROBAXIN ) 500 MG tablet  2 times daily        07/07/23 1903    lidocaine  (LIDODERM ) 5 %  Every 24 hours        07/07/23 1903              Lucina Sabal, PA-C 07/07/23 1903    Rosealee Concha, MD 07/07/23 2332

## 2023-07-07 NOTE — ED Provider Notes (Signed)
 Patient with history of PE, on Eliquis presents to the ED today for right sided chest pain x1 week. Pain improved with pain medications, possibly due to muscle spasm. Disposition pending PE study. If negative, discharge home with robaxin  and lidocaine  patches. Physical Exam  BP (!) 154/82 (BP Location: Left Arm)   Pulse 65   Temp 97.8 F (36.6 C)   Resp (!) 22   Ht 5\' 6"  (1.676 m)   Wt 117.9 kg   LMP 03/30/2021   SpO2 100%   BMI 41.95 kg/m   Physical Exam  Procedures  Procedures  ED Course / MDM    Medical Decision Making Amount and/or Complexity of Data Reviewed Labs: ordered. Radiology: ordered.  Risk Prescription drug management.   CT negative for PE. Discussed results with patient. All questions answered. She is stable and safe for discharge home. Return precautions given.    Cassidy Dusky, PA-C 07/08/23 0021    Rosealee Concha, MD 07/08/23 [id number]

## 2023-07-07 NOTE — ED Notes (Signed)
 EDP at bedside

## 2024-02-17 ENCOUNTER — Encounter: Admitting: Obstetrics and Gynecology

## 2024-03-29 ENCOUNTER — Encounter
